# Patient Record
Sex: Female | Born: 1935
Health system: Southern US, Community
[De-identification: ages and names within clinical notes are randomized; demographics above are authoritative.]

## PROBLEM LIST (undated history)

## (undated) DIAGNOSIS — M797 Fibromyalgia: Secondary | ICD-10-CM

## (undated) DIAGNOSIS — H269 Unspecified cataract: Secondary | ICD-10-CM

## (undated) DIAGNOSIS — C50919 Malignant neoplasm of unspecified site of unspecified female breast: Secondary | ICD-10-CM

## (undated) DIAGNOSIS — Z8669 Personal history of other diseases of the nervous system and sense organs: Secondary | ICD-10-CM

## (undated) DIAGNOSIS — I10 Essential (primary) hypertension: Secondary | ICD-10-CM

## (undated) DIAGNOSIS — M25561 Pain in right knee: Secondary | ICD-10-CM

## (undated) DIAGNOSIS — E785 Hyperlipidemia, unspecified: Secondary | ICD-10-CM

## (undated) DIAGNOSIS — G5601 Carpal tunnel syndrome, right upper limb: Secondary | ICD-10-CM

## (undated) DIAGNOSIS — M5126 Other intervertebral disc displacement, lumbar region: Secondary | ICD-10-CM

## (undated) DIAGNOSIS — M549 Dorsalgia, unspecified: Secondary | ICD-10-CM

## (undated) DIAGNOSIS — J039 Acute tonsillitis, unspecified: Secondary | ICD-10-CM

## (undated) HISTORY — DX: Essential (primary) hypertension: I10

## (undated) HISTORY — PX: TONSILLECTOMY: SUR1361

## (undated) HISTORY — DX: Acute tonsillitis, unspecified: J03.90

## (undated) HISTORY — DX: Hyperlipidemia, unspecified: E78.5

## (undated) HISTORY — DX: Personal history of other diseases of the nervous system and sense organs: Z86.69

## (undated) HISTORY — DX: Malignant neoplasm of unspecified site of unspecified female breast: C50.919

## (undated) HISTORY — DX: Pain in right knee: M25.561

## (undated) HISTORY — DX: Fibromyalgia: M79.7

## (undated) HISTORY — DX: Dorsalgia, unspecified: M54.9

## (undated) HISTORY — DX: Other intervertebral disc displacement, lumbar region: M51.26

## (undated) HISTORY — PX: BREAST LUMPECTOMY WITH AXILLARY LYMPH NODE BIOPSY: SHX5593

## (undated) HISTORY — PX: ANKLE SURGERY: SHX546

## (undated) HISTORY — DX: Carpal tunnel syndrome, right upper limb: G56.01

## (undated) HISTORY — PX: CARPAL TUNNEL RELEASE: SHX101

## (undated) HISTORY — DX: Unspecified cataract: H26.9

---

## 2003-03-02 DIAGNOSIS — M549 Dorsalgia, unspecified: Secondary | ICD-10-CM

## 2003-03-02 HISTORY — DX: Dorsalgia, unspecified: M54.9

## 2004-03-01 HISTORY — PX: LUMBAR DISC SURGERY: SHX700

## 2004-08-19 ENCOUNTER — Emergency Department (HOSPITAL_COMMUNITY): Admission: EM | Admit: 2004-08-19 | Discharge: 2004-08-19 | Payer: Self-pay | Admitting: Emergency Medicine

## 2004-08-21 ENCOUNTER — Ambulatory Visit (HOSPITAL_COMMUNITY): Admission: RE | Admit: 2004-08-21 | Discharge: 2004-08-21 | Payer: Self-pay | Admitting: Emergency Medicine

## 2004-08-22 ENCOUNTER — Emergency Department (HOSPITAL_COMMUNITY): Admission: EM | Admit: 2004-08-22 | Discharge: 2004-08-22 | Payer: Self-pay | Admitting: Emergency Medicine

## 2004-09-02 ENCOUNTER — Encounter
Admission: RE | Admit: 2004-09-02 | Discharge: 2004-09-02 | Payer: Self-pay | Admitting: Physical Medicine and Rehabilitation

## 2004-10-16 ENCOUNTER — Encounter: Admission: RE | Admit: 2004-10-16 | Discharge: 2004-10-16 | Payer: Self-pay | Admitting: Orthopedic Surgery

## 2004-11-10 ENCOUNTER — Inpatient Hospital Stay (HOSPITAL_COMMUNITY): Admission: RE | Admit: 2004-11-10 | Discharge: 2004-11-12 | Payer: Self-pay | Admitting: Orthopedic Surgery

## 2006-03-01 DIAGNOSIS — M25561 Pain in right knee: Secondary | ICD-10-CM

## 2006-03-01 HISTORY — PX: CATARACT EXTRACTION: SUR2

## 2006-03-01 HISTORY — DX: Pain in right knee: M25.561

## 2007-01-30 ENCOUNTER — Ambulatory Visit: Payer: Self-pay | Admitting: Oncology

## 2007-02-01 LAB — CBC WITH DIFFERENTIAL/PLATELET
Basophils Absolute: 0 10*3/uL (ref 0.0–0.1)
Eosinophils Absolute: 0.1 10*3/uL (ref 0.0–0.5)
HGB: 13.4 g/dL (ref 11.6–15.9)
MONO#: 0.6 10*3/uL (ref 0.1–0.9)
NEUT#: 4 10*3/uL (ref 1.5–6.5)
RBC: 3.95 10*6/uL (ref 3.70–5.32)
RDW: 13.2 % (ref 11.3–14.5)
WBC: 6.1 10*3/uL (ref 3.9–10.0)

## 2007-02-01 LAB — COMPREHENSIVE METABOLIC PANEL
Albumin: 4.5 g/dL (ref 3.5–5.2)
BUN: 19 mg/dL (ref 6–23)
CO2: 29 mEq/L (ref 19–32)
Calcium: 9.5 mg/dL (ref 8.4–10.5)
Chloride: 99 mEq/L (ref 96–112)
Glucose, Bld: 103 mg/dL — ABNORMAL HIGH (ref 70–99)
Potassium: 4 mEq/L (ref 3.5–5.3)
Sodium: 140 mEq/L (ref 135–145)
Total Protein: 7.6 g/dL (ref 6.0–8.3)

## 2007-02-01 LAB — LACTATE DEHYDROGENASE: LDH: 176 U/L (ref 94–250)

## 2007-02-02 ENCOUNTER — Encounter: Payer: Self-pay | Admitting: Oncology

## 2007-02-02 ENCOUNTER — Ambulatory Visit: Payer: Self-pay

## 2007-02-06 LAB — VITAMIN D PNL(25-HYDRXY+1,25-DIHY)-BLD
Vit D, 1,25-Dihydroxy: 42 pg/mL (ref 6–62)
Vit D, 25-Hydroxy: 32 ng/mL (ref 30–89)

## 2007-02-08 ENCOUNTER — Ambulatory Visit (HOSPITAL_COMMUNITY): Admission: RE | Admit: 2007-02-08 | Discharge: 2007-02-08 | Payer: Self-pay | Admitting: Oncology

## 2007-02-13 ENCOUNTER — Ambulatory Visit (HOSPITAL_COMMUNITY): Admission: RE | Admit: 2007-02-13 | Discharge: 2007-02-13 | Payer: Self-pay | Admitting: Oncology

## 2007-02-22 ENCOUNTER — Ambulatory Visit (HOSPITAL_BASED_OUTPATIENT_CLINIC_OR_DEPARTMENT_OTHER): Admission: RE | Admit: 2007-02-22 | Discharge: 2007-02-22 | Payer: Self-pay | Admitting: Surgery

## 2007-03-08 LAB — CBC WITH DIFFERENTIAL/PLATELET
Basophils Absolute: 0 10*3/uL (ref 0.0–0.1)
Eosinophils Absolute: 0.2 10*3/uL (ref 0.0–0.5)
HGB: 12.4 g/dL (ref 11.6–15.9)
NEUT#: 1.9 10*3/uL (ref 1.5–6.5)
RDW: 12.9 % (ref 11.3–14.5)
lymph#: 0.9 10*3/uL (ref 0.9–3.3)

## 2007-03-08 IMAGING — CT CT L SPINE W/ CM
3 of 8 series · 15 of 33 positions shown, 18 images · IV contrast (omnipaque)
Comparison: none

CLINICAL DATA: Pain in the back extends into the right knee and leg.   The patient was referred for a lumbar myelogram. 
 LUMBAR MYELOGRAM:
 Using sterile technique, a 22 gauge spinal needle was placed into the thecal sac centrally at L4.  CSF is clear. 15 cc Omnipaque 180 contrast was injected.  The needle was withdrawn and multiple images were obtained. 
 Patient with five non-rib bearing lumbar vertebra.  Mild rotoscoliotic deformity.  Central stenosis is appreciated at L4-5.  Poor filling of the right L4 and right L5 root sleeves.  An extradural defect is demonstrated on the right most notably at L4-5.  In the lateral projection, ventral defects are seen at L2-3, L3-4 and L4-5.

[Series 4: recon 3: l-spine helical · axial · 0.27mm/px · z∈[-49,+85]mm · 7 of 143 slices shown, 9 images]
[im 18/143  soft-tissue]
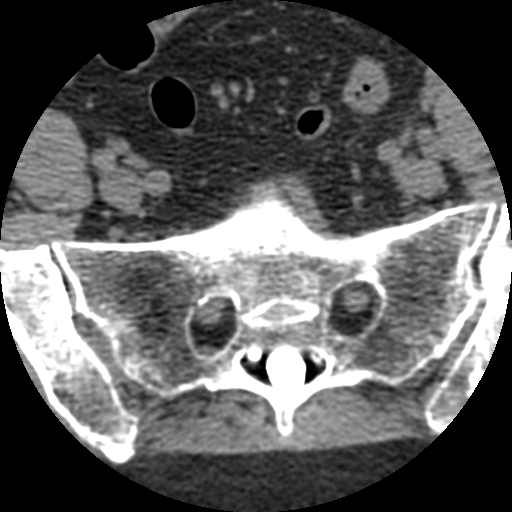
[im 18/143  bone]
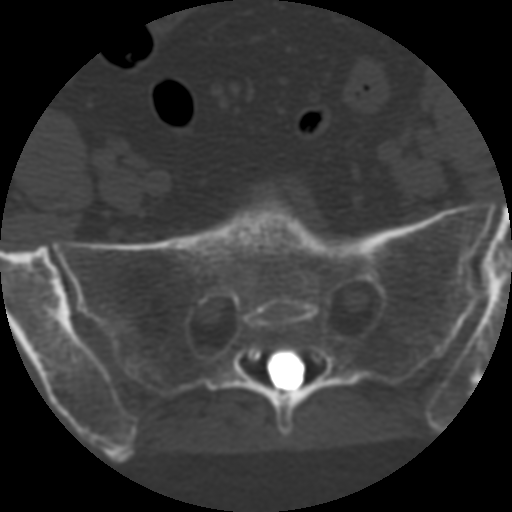
[im 36/143  bone]
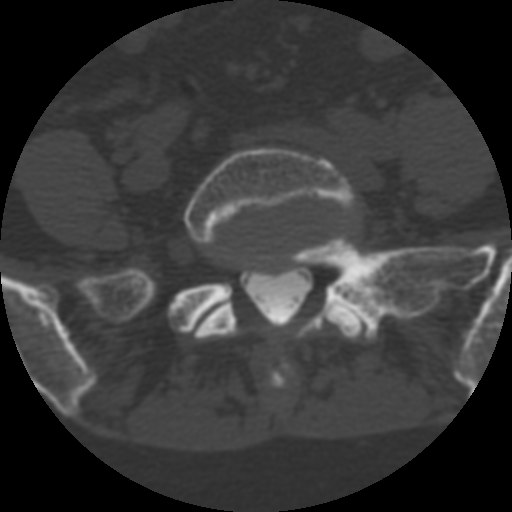
[im 54/143  bone]
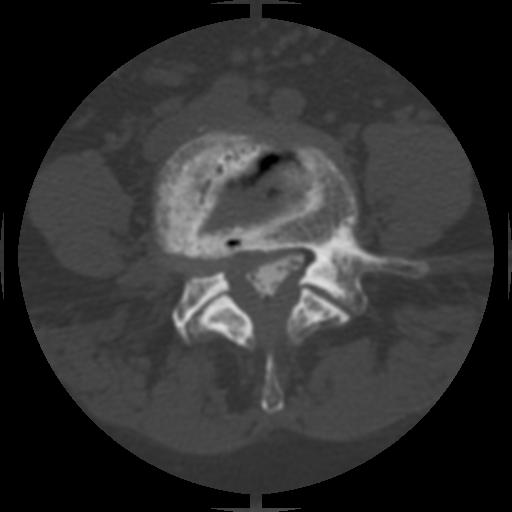
[im 72/143  bone]
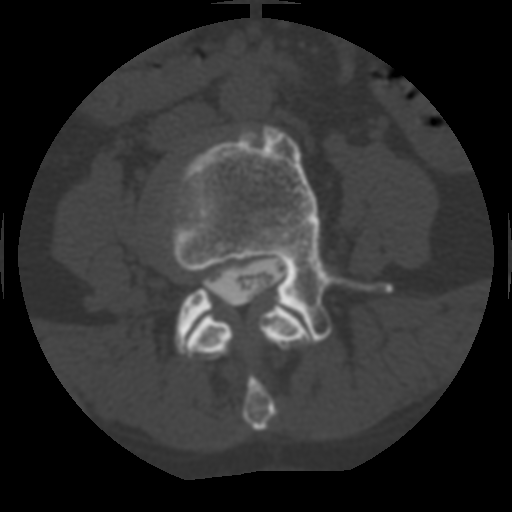
[im 89/143  soft-tissue]
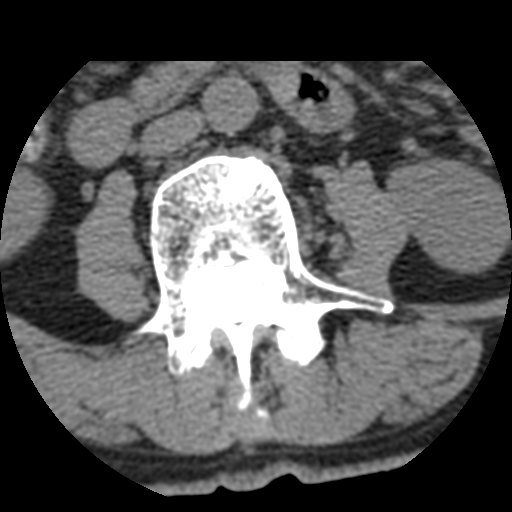
[im 89/143  bone]
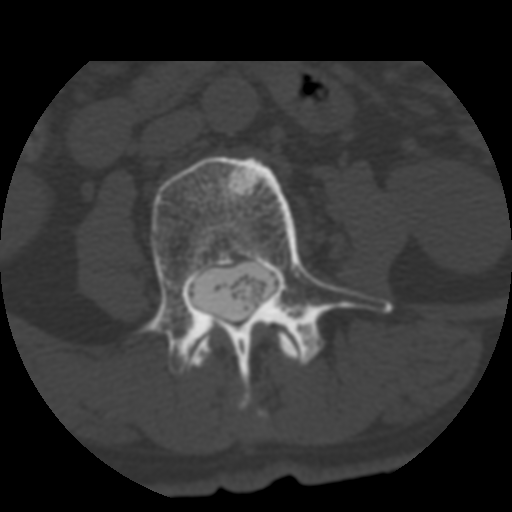
[im 107/143  bone]
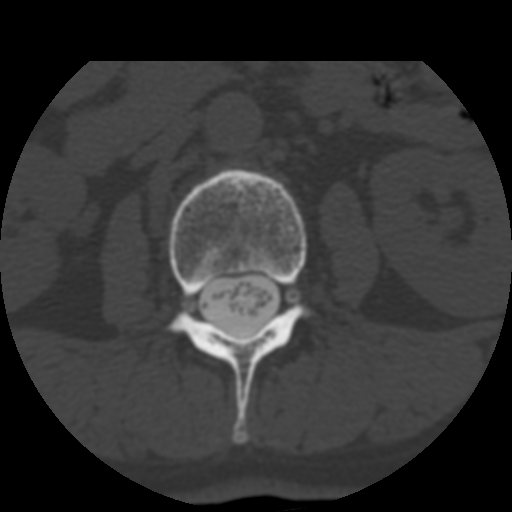
[im 125/143  bone]
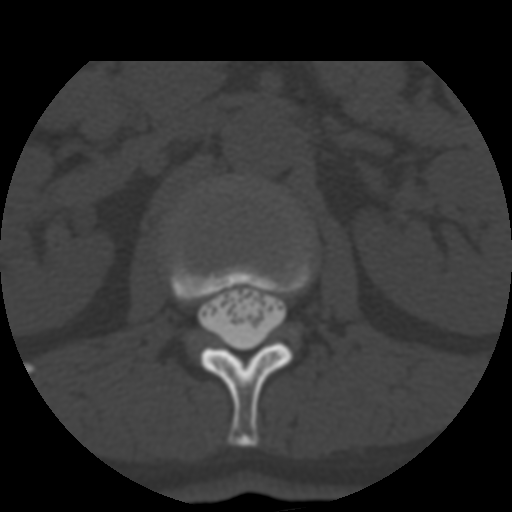

[Series 400: reformatted · sagittal · 0.36mm/px · 3 of 40 slices shown (1 of 2)]
[im 8/40  bone]
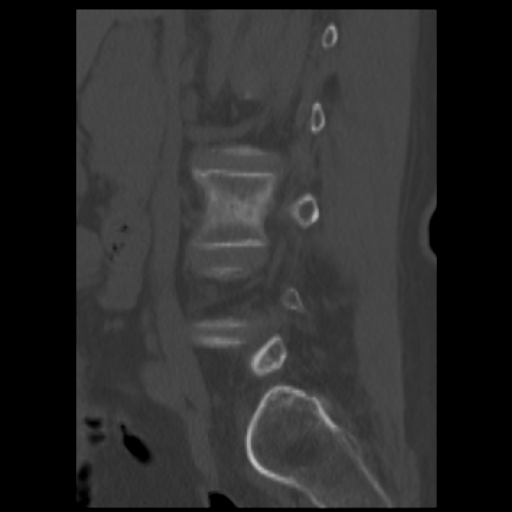
[im 16/40  bone]
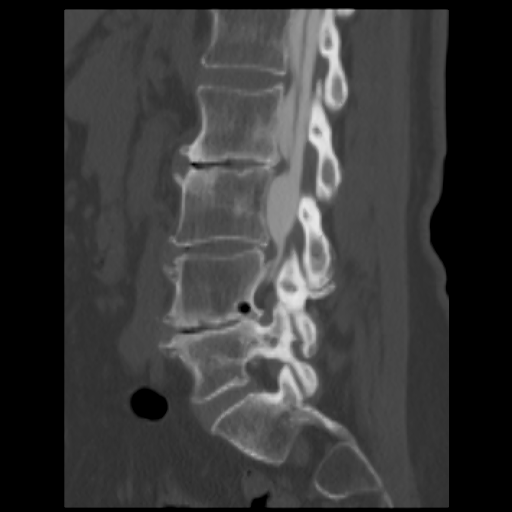
[im 24/40  bone]
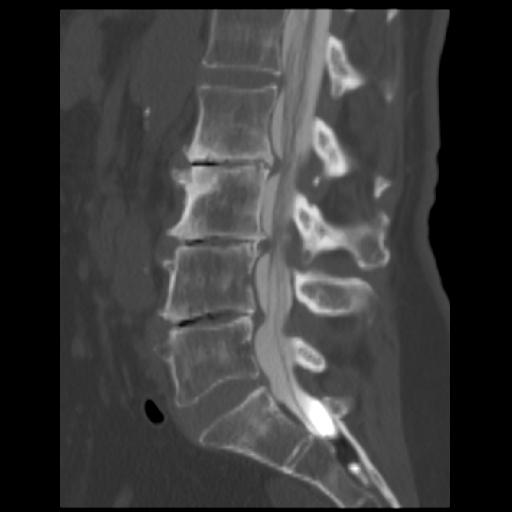

[Series 401: reformatted · coronal · 0.36mm/px · 5 of 40 slices shown, 6 images (2 of 2)]
[im 14/40  bone]
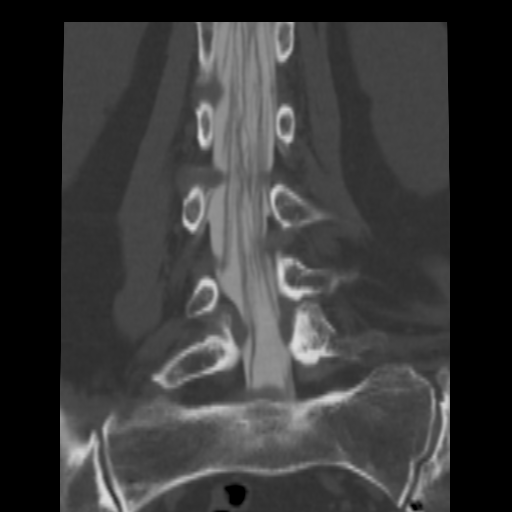
[im 17/40  bone]
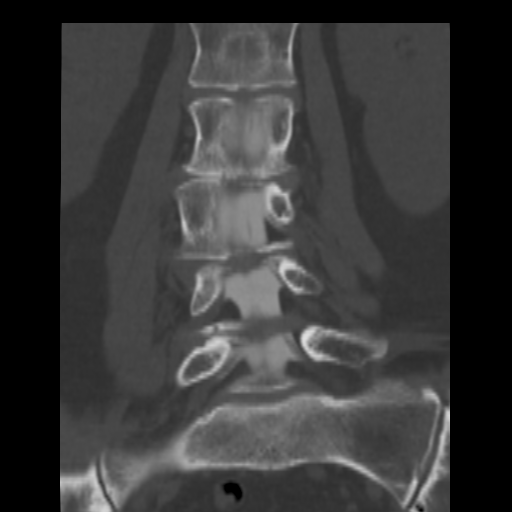
[im 20/40  soft-tissue]
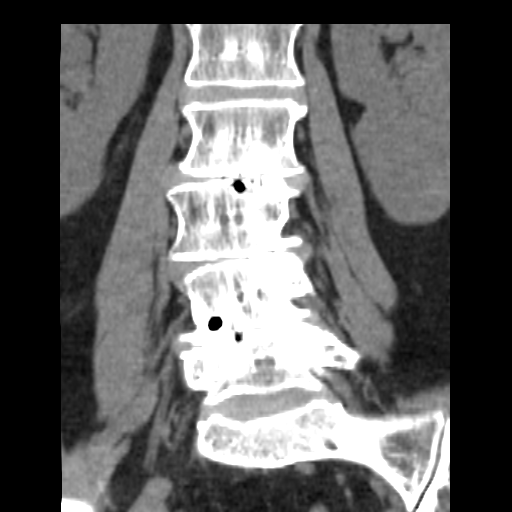
[im 20/40  bone]
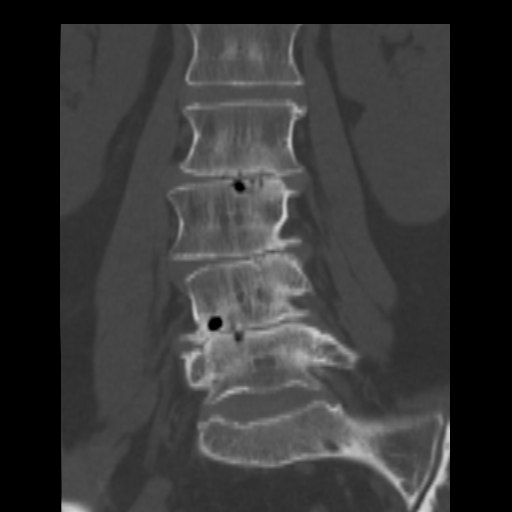
[im 23/40  bone]
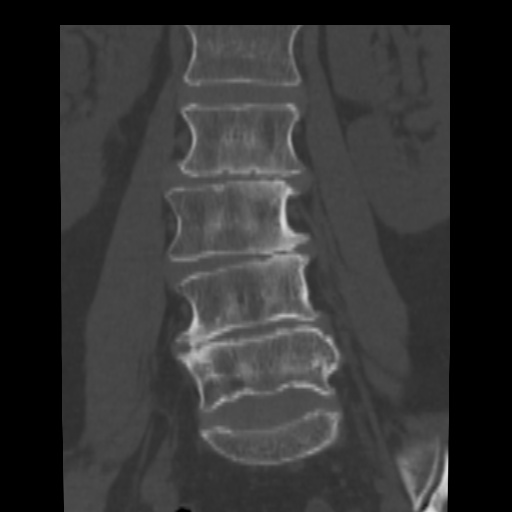
[im 27/40  bone]
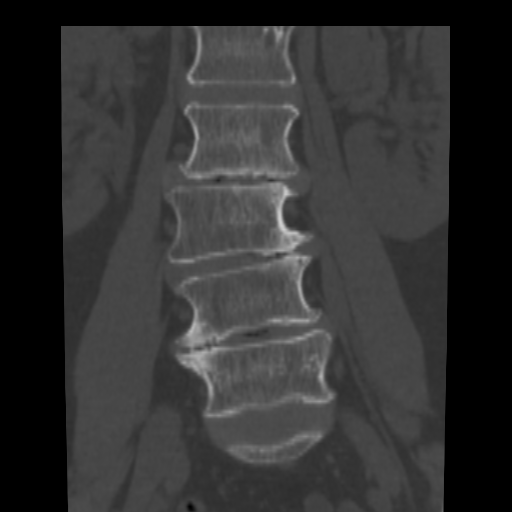

[15 of 33 positions shown; findings below may reference images not displayed]

IMPRESSION: Multilevel degenerative changes most severely at L4-5.  Central stenosis is appreciated with poor filling of the right L4 and L5 root sleeves.  CT to follow.
 CT LUMBAR SPINE POST INTRATHECAL CONTRAST:
 Helical imaging was performed and supplemented with coronal and sagittal reformat images:
 In the sagittal projection, degenerative disk disease is appreciated at L2-3, L3-4, and L4-5.  Ventral defects at each level.  At L4-5, foraminal narrowing is seen bilaterally, right greater than left which is multifactorial.  
 Axial imaging: 
 L1-2:  Essentially normal interspace.
 L2-3:  Disk height loss with vacuum disk phenomenon.  Reactive endplate changes noted.   Diffuse disk bulge without central nor foraminal stenosis.   Facet degenerative change.
 L3-4:  Left foraminal stenosis associated with a foraminal disk bulge and facet hypertrophy.  Additionally, there is left lateral recess stenosis.  The right L3 root exits without encroachment.  No appreciable central stenosis.
 L4-5:  Right paracentral/foraminal disk extrusion.  Associated facet degenerative change bilaterally.  Severe multifactorial central stenosis as well as right lateral recess stenosis.  Disk material in addition to osteophytic spurring narrow the right L4 foramen and likely contributes to L4 symptoms.  The left L4 foramen is narrowed inferiorly without encroachment upon the dorsal root ganglion.
 L5-S1:  Shallow central disk protrusion without mass effect.  The L5 roots exit without encroachment.  No central stenosis or facet degenerative change.
IMPRESSION: 1.  L4-5:   Paracentral/foraminal disk extrusion with caudal extension of disk material.  Additionally, there is disk height loss and facet hypertrophy.  Multifactorial right foraminal stenosis as well as right lateral recess and multifactorial central stenosis.
 2.  L3-4:  Left foraminal stenosis is multifactorial.
 3.  L2-3:  Degenerative disk disease.  No appreciable central nor foraminal stenosis.

## 2007-03-15 ENCOUNTER — Ambulatory Visit: Payer: Self-pay | Admitting: Oncology

## 2007-03-17 LAB — CBC WITH DIFFERENTIAL/PLATELET
Basophils Absolute: 0.1 10*3/uL (ref 0.0–0.1)
Eosinophils Absolute: 0 10*3/uL (ref 0.0–0.5)
HGB: 11.7 g/dL (ref 11.6–15.9)
LYMPH%: 13.7 % — ABNORMAL LOW (ref 14.0–48.0)
MCV: 95.1 fL (ref 81.0–101.0)
MONO%: 6.5 % (ref 0.0–13.0)
NEUT#: 9.9 10*3/uL — ABNORMAL HIGH (ref 1.5–6.5)
Platelets: 142 10*3/uL — ABNORMAL LOW (ref 145–400)
RDW: 10.5 % — ABNORMAL LOW (ref 11.3–14.5)

## 2007-03-17 LAB — BASIC METABOLIC PANEL
BUN: 19 mg/dL (ref 6–23)
CO2: 25 mEq/L (ref 19–32)
Glucose, Bld: 117 mg/dL — ABNORMAL HIGH (ref 70–99)
Potassium: 4.1 mEq/L (ref 3.5–5.3)

## 2007-03-24 LAB — CBC WITH DIFFERENTIAL/PLATELET
BASO%: 0.1 % (ref 0.0–2.0)
Basophils Absolute: 0 10*3/uL (ref 0.0–0.1)
Eosinophils Absolute: 0 10*3/uL (ref 0.0–0.5)
HCT: 30.3 % — ABNORMAL LOW (ref 34.8–46.6)
HGB: 10.8 g/dL — ABNORMAL LOW (ref 11.6–15.9)
LYMPH%: 7.4 % — ABNORMAL LOW (ref 14.0–48.0)
MONO#: 0.5 10*3/uL (ref 0.1–0.9)
NEUT#: 12.3 10*3/uL — ABNORMAL HIGH (ref 1.5–6.5)
NEUT%: 88.7 % — ABNORMAL HIGH (ref 39.6–76.8)
Platelets: 335 10*3/uL (ref 145–400)
WBC: 13.9 10*3/uL — ABNORMAL HIGH (ref 3.9–10.0)
lymph#: 1 10*3/uL (ref 0.9–3.3)

## 2007-03-24 LAB — COMPREHENSIVE METABOLIC PANEL
ALT: 15 U/L (ref 0–35)
BUN: 16 mg/dL (ref 6–23)
CO2: 23 mEq/L (ref 19–32)
Calcium: 8.8 mg/dL (ref 8.4–10.5)
Chloride: 96 mEq/L (ref 96–112)
Creatinine, Ser: 0.59 mg/dL (ref 0.40–1.20)
Glucose, Bld: 179 mg/dL — ABNORMAL HIGH (ref 70–99)

## 2007-03-31 LAB — CBC WITH DIFFERENTIAL/PLATELET
BASO%: 1.1 % (ref 0.0–2.0)
Eosinophils Absolute: 0 10*3/uL (ref 0.0–0.5)
HCT: 32.3 % — ABNORMAL LOW (ref 34.8–46.6)
MCHC: 36.3 g/dL — ABNORMAL HIGH (ref 32.0–36.0)
MONO#: 2.1 10*3/uL — ABNORMAL HIGH (ref 0.1–0.9)
NEUT#: 10.9 10*3/uL — ABNORMAL HIGH (ref 1.5–6.5)
NEUT%: 71.4 % (ref 39.6–76.8)
WBC: 15.3 10*3/uL — ABNORMAL HIGH (ref 3.9–10.0)
lymph#: 2 10*3/uL (ref 0.9–3.3)

## 2007-03-31 LAB — COMPREHENSIVE METABOLIC PANEL
ALT: 19 U/L (ref 0–35)
CO2: 27 mEq/L (ref 19–32)
Calcium: 8.9 mg/dL (ref 8.4–10.5)
Chloride: 90 mEq/L — ABNORMAL LOW (ref 96–112)
Sodium: 130 mEq/L — ABNORMAL LOW (ref 135–145)
Total Protein: 7 g/dL (ref 6.0–8.3)

## 2007-03-31 LAB — LACTATE DEHYDROGENASE: LDH: 234 U/L (ref 94–250)

## 2007-04-07 LAB — CBC WITH DIFFERENTIAL/PLATELET
Basophils Absolute: 0.1 10*3/uL (ref 0.0–0.1)
Eosinophils Absolute: 0 10*3/uL (ref 0.0–0.5)
HCT: 29.3 % — ABNORMAL LOW (ref 34.8–46.6)
HGB: 10.7 g/dL — ABNORMAL LOW (ref 11.6–15.9)
LYMPH%: 7.7 % — ABNORMAL LOW (ref 14.0–48.0)
MCV: 96.1 fL (ref 81.0–101.0)
MONO%: 4.6 % (ref 0.0–13.0)
NEUT#: 15.1 10*3/uL — ABNORMAL HIGH (ref 1.5–6.5)
NEUT%: 87.3 % — ABNORMAL HIGH (ref 39.6–76.8)
Platelets: 153 10*3/uL (ref 145–400)

## 2007-04-07 LAB — COMPREHENSIVE METABOLIC PANEL
Albumin: 4.3 g/dL (ref 3.5–5.2)
Alkaline Phosphatase: 101 U/L (ref 39–117)
BUN: 22 mg/dL (ref 6–23)
Glucose, Bld: 94 mg/dL (ref 70–99)
Total Bilirubin: 0.3 mg/dL (ref 0.3–1.2)

## 2007-04-14 LAB — CBC WITH DIFFERENTIAL/PLATELET
Basophils Absolute: 0 10*3/uL (ref 0.0–0.1)
Eosinophils Absolute: 0 10*3/uL (ref 0.0–0.5)
HGB: 11.4 g/dL — ABNORMAL LOW (ref 11.6–15.9)
MCV: 97.1 fL (ref 81.0–101.0)
MONO#: 0.2 10*3/uL (ref 0.1–0.9)
MONO%: 3 % (ref 0.0–13.0)
NEUT#: 7.3 10*3/uL — ABNORMAL HIGH (ref 1.5–6.5)
Platelets: 203 10*3/uL (ref 145–400)
RDW: 14.8 % — ABNORMAL HIGH (ref 11.3–14.5)

## 2007-04-14 LAB — BASIC METABOLIC PANEL
BUN: 22 mg/dL (ref 6–23)
CO2: 24 mEq/L (ref 19–32)
Glucose, Bld: 136 mg/dL — ABNORMAL HIGH (ref 70–99)
Potassium: 3.9 mEq/L (ref 3.5–5.3)

## 2007-04-21 LAB — CBC WITH DIFFERENTIAL/PLATELET
BASO%: 0.5 % (ref 0.0–2.0)
Basophils Absolute: 0.1 10*3/uL (ref 0.0–0.1)
EOS%: 0.1 % (ref 0.0–7.0)
HCT: 29.6 % — ABNORMAL LOW (ref 34.8–46.6)
HGB: 10.4 g/dL — ABNORMAL LOW (ref 11.6–15.9)
LYMPH%: 9.3 % — ABNORMAL LOW (ref 14.0–48.0)
MCH: 35.8 pg — ABNORMAL HIGH (ref 26.0–34.0)
MCHC: 35.2 g/dL (ref 32.0–36.0)
MCV: 101.5 fL — ABNORMAL HIGH (ref 81.0–101.0)
MONO%: 5.7 % (ref 0.0–13.0)
NEUT%: 84.4 % — ABNORMAL HIGH (ref 39.6–76.8)
Platelets: 219 10*3/uL (ref 145–400)

## 2007-04-26 ENCOUNTER — Ambulatory Visit: Payer: Self-pay | Admitting: Oncology

## 2007-04-28 LAB — CBC WITH DIFFERENTIAL/PLATELET
BASO%: 1 % (ref 0.0–2.0)
Basophils Absolute: 0.1 10*3/uL (ref 0.0–0.1)
EOS%: 0.1 % (ref 0.0–7.0)
HGB: 11.3 g/dL — ABNORMAL LOW (ref 11.6–15.9)
MCH: 36.5 pg — ABNORMAL HIGH (ref 26.0–34.0)
MCHC: 35.2 g/dL (ref 32.0–36.0)
MCV: 104 fL — ABNORMAL HIGH (ref 81.0–101.0)
MONO%: 6.6 % (ref 0.0–13.0)
RDW: 16.3 % — ABNORMAL HIGH (ref 11.3–14.5)
lymph#: 1.1 10*3/uL (ref 0.9–3.3)

## 2007-04-28 LAB — BASIC METABOLIC PANEL
BUN: 29 mg/dL — ABNORMAL HIGH (ref 6–23)
Chloride: 97 mEq/L (ref 96–112)
Creatinine, Ser: 0.49 mg/dL (ref 0.40–1.20)
Potassium: 4.1 mEq/L (ref 3.5–5.3)

## 2007-05-05 LAB — COMPREHENSIVE METABOLIC PANEL
Albumin: 4.5 g/dL (ref 3.5–5.2)
BUN: 26 mg/dL — ABNORMAL HIGH (ref 6–23)
CO2: 24 mEq/L (ref 19–32)
Calcium: 9.1 mg/dL (ref 8.4–10.5)
Chloride: 100 mEq/L (ref 96–112)
Glucose, Bld: 120 mg/dL — ABNORMAL HIGH (ref 70–99)
Potassium: 4.2 mEq/L (ref 3.5–5.3)
Sodium: 139 mEq/L (ref 135–145)
Total Protein: 7 g/dL (ref 6.0–8.3)

## 2007-05-05 LAB — CBC WITH DIFFERENTIAL/PLATELET
BASO%: 0.2 % (ref 0.0–2.0)
Basophils Absolute: 0 10*3/uL (ref 0.0–0.1)
EOS%: 0.1 % (ref 0.0–7.0)
Eosinophils Absolute: 0 10*3/uL (ref 0.0–0.5)
HCT: 31.5 % — ABNORMAL LOW (ref 34.8–46.6)
HGB: 10.8 g/dL — ABNORMAL LOW (ref 11.6–15.9)
LYMPH%: 5.3 % — ABNORMAL LOW (ref 14.0–48.0)
MCH: 36.1 pg — ABNORMAL HIGH (ref 26.0–34.0)
MCHC: 34.4 g/dL (ref 32.0–36.0)
MCV: 105 fL — ABNORMAL HIGH (ref 81.0–101.0)
MONO#: 0.7 10*3/uL (ref 0.1–0.9)
MONO%: 5 % (ref 0.0–13.0)
NEUT#: 12.2 10*3/uL — ABNORMAL HIGH (ref 1.5–6.5)
NEUT%: 89.5 % — ABNORMAL HIGH (ref 39.6–76.8)
Platelets: 230 10*3/uL (ref 145–400)
RBC: 3 10*6/uL — ABNORMAL LOW (ref 3.70–5.32)
RDW: 16.6 % — ABNORMAL HIGH (ref 11.3–14.5)
WBC: 13.6 10*3/uL — ABNORMAL HIGH (ref 3.9–10.0)
lymph#: 0.7 10*3/uL — ABNORMAL LOW (ref 0.9–3.3)

## 2007-05-12 LAB — CBC WITH DIFFERENTIAL/PLATELET
Basophils Absolute: 0.2 10*3/uL — ABNORMAL HIGH (ref 0.0–0.1)
Eosinophils Absolute: 0 10*3/uL (ref 0.0–0.5)
HGB: 11.4 g/dL — ABNORMAL LOW (ref 11.6–15.9)
MCV: 104 fL — ABNORMAL HIGH (ref 81.0–101.0)
MONO%: 13.9 % — ABNORMAL HIGH (ref 0.0–13.0)
NEUT#: 9.9 10*3/uL — ABNORMAL HIGH (ref 1.5–6.5)
RBC: 3.12 10*6/uL — ABNORMAL LOW (ref 3.70–5.32)
RDW: 14.9 % — ABNORMAL HIGH (ref 11.3–14.5)
WBC: 13.4 10*3/uL — ABNORMAL HIGH (ref 3.9–10.0)
lymph#: 1.4 10*3/uL (ref 0.9–3.3)

## 2007-05-19 LAB — CBC WITH DIFFERENTIAL/PLATELET
Basophils Absolute: 0.2 10*3/uL — ABNORMAL HIGH (ref 0.0–0.1)
Eosinophils Absolute: 0 10*3/uL (ref 0.0–0.5)
HGB: 11.1 g/dL — ABNORMAL LOW (ref 11.6–15.9)
MCV: 108 fL — ABNORMAL HIGH (ref 81.0–101.0)
MONO%: 10.3 % (ref 0.0–13.0)
NEUT#: 6.9 10*3/uL — ABNORMAL HIGH (ref 1.5–6.5)
RDW: 15.9 % — ABNORMAL HIGH (ref 11.3–14.5)
lymph#: 1.2 10*3/uL (ref 0.9–3.3)

## 2007-06-02 LAB — CBC WITH DIFFERENTIAL/PLATELET
BASO%: 0.4 % (ref 0.0–2.0)
Basophils Absolute: 0.1 10*3/uL (ref 0.0–0.1)
EOS%: 0.1 % (ref 0.0–7.0)
Eosinophils Absolute: 0 10*3/uL (ref 0.0–0.5)
HCT: 32 % — ABNORMAL LOW (ref 34.8–46.6)
HGB: 11.3 g/dL — ABNORMAL LOW (ref 11.6–15.9)
LYMPH%: 14.1 % (ref 14.0–48.0)
MCH: 37.5 pg — ABNORMAL HIGH (ref 26.0–34.0)
MCHC: 35.1 g/dL (ref 32.0–36.0)
MCV: 107 fL — ABNORMAL HIGH (ref 81.0–101.0)
MONO#: 0.4 10*3/uL (ref 0.1–0.9)
MONO%: 3 % (ref 0.0–13.0)
NEUT#: 11 10*3/uL — ABNORMAL HIGH (ref 1.5–6.5)
NEUT%: 82.5 % — ABNORMAL HIGH (ref 39.6–76.8)
Platelets: 175 10*3/uL (ref 145–400)
RBC: 3 10*6/uL — ABNORMAL LOW (ref 3.70–5.32)
RDW: 13.9 % (ref 11.3–14.5)
WBC: 13.3 10*3/uL — ABNORMAL HIGH (ref 3.9–10.0)
lymph#: 1.9 10*3/uL (ref 0.9–3.3)

## 2007-06-07 ENCOUNTER — Ambulatory Visit: Payer: Self-pay | Admitting: Oncology

## 2007-06-09 LAB — CBC WITH DIFFERENTIAL/PLATELET
Basophils Absolute: 0.1 10*3/uL (ref 0.0–0.1)
Eosinophils Absolute: 0 10*3/uL (ref 0.0–0.5)
HGB: 11.6 g/dL (ref 11.6–15.9)
MCV: 109 fL — ABNORMAL HIGH (ref 81.0–101.0)
MONO%: 8.8 % (ref 0.0–13.0)
NEUT#: 6.9 10*3/uL — ABNORMAL HIGH (ref 1.5–6.5)
Platelets: 130 10*3/uL — ABNORMAL LOW (ref 145–400)
RDW: 14.3 % (ref 11.3–14.5)

## 2007-06-15 LAB — CBC WITH DIFFERENTIAL/PLATELET
Basophils Absolute: 0 10*3/uL (ref 0.0–0.1)
Eosinophils Absolute: 0 10*3/uL (ref 0.0–0.5)
HGB: 11.1 g/dL — ABNORMAL LOW (ref 11.6–15.9)
LYMPH%: 16.4 % (ref 14.0–48.0)
MCV: 108.9 fL — ABNORMAL HIGH (ref 81.0–101.0)
MONO#: 0.4 10*3/uL (ref 0.1–0.9)
MONO%: 7.2 % (ref 0.0–13.0)
NEUT#: 4.6 10*3/uL (ref 1.5–6.5)
Platelets: 152 10*3/uL (ref 145–400)
WBC: 6.1 10*3/uL (ref 3.9–10.0)

## 2007-06-15 LAB — COMPREHENSIVE METABOLIC PANEL
Albumin: 4.4 g/dL (ref 3.5–5.2)
Alkaline Phosphatase: 71 U/L (ref 39–117)
BUN: 21 mg/dL (ref 6–23)
CO2: 28 mEq/L (ref 19–32)
Glucose, Bld: 99 mg/dL (ref 70–99)
Potassium: 3.7 mEq/L (ref 3.5–5.3)
Total Bilirubin: 0.2 mg/dL — ABNORMAL LOW (ref 0.3–1.2)
Total Protein: 6.7 g/dL (ref 6.0–8.3)

## 2007-06-23 LAB — CBC WITH DIFFERENTIAL/PLATELET
Basophils Absolute: 0.1 10*3/uL (ref 0.0–0.1)
Eosinophils Absolute: 0 10*3/uL (ref 0.0–0.5)
HGB: 11.4 g/dL — ABNORMAL LOW (ref 11.6–15.9)
MCV: 109 fL — ABNORMAL HIGH (ref 81.0–101.0)
MONO#: 1.4 10*3/uL — ABNORMAL HIGH (ref 0.1–0.9)
NEUT#: 8.6 10*3/uL — ABNORMAL HIGH (ref 1.5–6.5)
RDW: 12.6 % (ref 11.3–14.5)
WBC: 11.8 10*3/uL — ABNORMAL HIGH (ref 3.9–10.0)
lymph#: 1.7 10*3/uL (ref 0.9–3.3)

## 2007-06-30 LAB — CBC WITH DIFFERENTIAL/PLATELET
BASO%: 1.8 % (ref 0.0–2.0)
EOS%: 0 % (ref 0.0–7.0)
MCH: 37.7 pg — ABNORMAL HIGH (ref 26.0–34.0)
MCHC: 34.5 g/dL (ref 32.0–36.0)
RDW: 12.7 % (ref 11.3–14.5)
lymph#: 0.9 10*3/uL (ref 0.9–3.3)

## 2007-07-03 ENCOUNTER — Ambulatory Visit: Admission: RE | Admit: 2007-07-03 | Discharge: 2007-07-30 | Payer: Self-pay | Admitting: Radiation Oncology

## 2007-07-11 ENCOUNTER — Ambulatory Visit (HOSPITAL_BASED_OUTPATIENT_CLINIC_OR_DEPARTMENT_OTHER): Admission: RE | Admit: 2007-07-11 | Discharge: 2007-07-11 | Payer: Self-pay | Admitting: Surgery

## 2007-07-11 ENCOUNTER — Encounter (INDEPENDENT_AMBULATORY_CARE_PROVIDER_SITE_OTHER): Payer: Self-pay | Admitting: Surgery

## 2007-08-01 ENCOUNTER — Ambulatory Visit: Payer: Self-pay | Admitting: Oncology

## 2007-08-03 LAB — CANCER ANTIGEN 27.29: CA 27.29: 16 U/mL (ref 0–39)

## 2007-08-03 LAB — CBC WITH DIFFERENTIAL/PLATELET
Basophils Absolute: 0 10*3/uL (ref 0.0–0.1)
Eosinophils Absolute: 0.1 10*3/uL (ref 0.0–0.5)
HCT: 32.5 % — ABNORMAL LOW (ref 34.8–46.6)
HGB: 11.4 g/dL — ABNORMAL LOW (ref 11.6–15.9)
LYMPH%: 26.2 % (ref 14.0–48.0)
MCV: 106.5 fL — ABNORMAL HIGH (ref 81.0–101.0)
MONO#: 0.5 10*3/uL (ref 0.1–0.9)
NEUT#: 2.2 10*3/uL (ref 1.5–6.5)
NEUT%: 57.2 % (ref 39.6–76.8)
Platelets: 184 10*3/uL (ref 145–400)
RBC: 3.05 10*6/uL — ABNORMAL LOW (ref 3.70–5.32)
WBC: 3.9 10*3/uL (ref 3.9–10.0)

## 2007-08-03 LAB — COMPREHENSIVE METABOLIC PANEL
Albumin: 4.2 g/dL (ref 3.5–5.2)
BUN: 26 mg/dL — ABNORMAL HIGH (ref 6–23)
Calcium: 9.5 mg/dL (ref 8.4–10.5)
Chloride: 101 mEq/L (ref 96–112)
Creatinine, Ser: 0.64 mg/dL (ref 0.40–1.20)
Glucose, Bld: 104 mg/dL — ABNORMAL HIGH (ref 70–99)
Potassium: 3.7 mEq/L (ref 3.5–5.3)

## 2007-08-10 ENCOUNTER — Encounter: Payer: Self-pay | Admitting: Oncology

## 2007-08-10 ENCOUNTER — Ambulatory Visit: Payer: Self-pay

## 2007-08-11 LAB — CBC WITH DIFFERENTIAL/PLATELET
BASO%: 1.7 % (ref 0.0–2.0)
LYMPH%: 11.8 % — ABNORMAL LOW (ref 14.0–48.0)
MCHC: 34.5 g/dL (ref 32.0–36.0)
MONO#: 0.6 10*3/uL (ref 0.1–0.9)
NEUT#: 3.2 10*3/uL (ref 1.5–6.5)
RBC: 3.38 10*6/uL — ABNORMAL LOW (ref 3.70–5.32)
RDW: 10.9 % — ABNORMAL LOW (ref 11.3–14.5)
WBC: 4.6 10*3/uL (ref 3.9–10.0)
lymph#: 0.5 10*3/uL — ABNORMAL LOW (ref 0.9–3.3)

## 2007-08-14 LAB — COMPREHENSIVE METABOLIC PANEL
ALT: 9 U/L (ref 0–35)
Albumin: 4.2 g/dL (ref 3.5–5.2)
CO2: 24 mEq/L (ref 19–32)
Chloride: 108 mEq/L (ref 96–112)
Potassium: 3.8 mEq/L (ref 3.5–5.3)
Sodium: 143 mEq/L (ref 135–145)
Total Bilirubin: 0.5 mg/dL (ref 0.3–1.2)
Total Protein: 7.5 g/dL (ref 6.0–8.3)

## 2007-08-14 LAB — LACTATE DEHYDROGENASE: LDH: 186 U/L (ref 94–250)

## 2007-08-14 LAB — VITAMIN D 25 HYDROXY (VIT D DEFICIENCY, FRACTURES): Vit D, 25-Hydroxy: 26 ng/mL — ABNORMAL LOW (ref 30–89)

## 2007-08-14 LAB — CANCER ANTIGEN 27.29: CA 27.29: 14 U/mL (ref 0–39)

## 2007-08-31 LAB — COMPREHENSIVE METABOLIC PANEL
Alkaline Phosphatase: 48 U/L (ref 39–117)
BUN: 21 mg/dL (ref 6–23)
CO2: 26 mEq/L (ref 19–32)
Creatinine, Ser: 0.52 mg/dL (ref 0.40–1.20)
Glucose, Bld: 99 mg/dL (ref 70–99)
Total Bilirubin: 0.3 mg/dL (ref 0.3–1.2)

## 2007-08-31 LAB — CBC WITH DIFFERENTIAL/PLATELET
Eosinophils Absolute: 0.1 10*3/uL (ref 0.0–0.5)
HCT: 34 % — ABNORMAL LOW (ref 34.8–46.6)
LYMPH%: 25.2 % (ref 14.0–48.0)
MCV: 102 fL — ABNORMAL HIGH (ref 81.0–101.0)
MONO#: 0.5 10*3/uL (ref 0.1–0.9)
MONO%: 13 % (ref 0.0–13.0)
NEUT#: 2.3 10*3/uL (ref 1.5–6.5)
NEUT%: 58.6 % (ref 39.6–76.8)
Platelets: 168 10*3/uL (ref 145–400)
RBC: 3.33 10*6/uL — ABNORMAL LOW (ref 3.70–5.32)
WBC: 3.8 10*3/uL — ABNORMAL LOW (ref 3.9–10.0)

## 2007-09-19 ENCOUNTER — Ambulatory Visit: Payer: Self-pay | Admitting: Oncology

## 2007-10-13 LAB — COMPREHENSIVE METABOLIC PANEL
ALT: 19 U/L (ref 0–35)
Alkaline Phosphatase: 52 U/L (ref 39–117)
CO2: 26 mEq/L (ref 19–32)
Creatinine, Ser: 0.54 mg/dL (ref 0.40–1.20)
Glucose, Bld: 104 mg/dL — ABNORMAL HIGH (ref 70–99)
Sodium: 137 mEq/L (ref 135–145)
Total Bilirubin: 0.3 mg/dL (ref 0.3–1.2)
Total Protein: 7.3 g/dL (ref 6.0–8.3)

## 2007-10-13 LAB — CBC WITH DIFFERENTIAL/PLATELET
BASO%: 0.5 % (ref 0.0–2.0)
HCT: 36.4 % (ref 34.8–46.6)
LYMPH%: 21 % (ref 14.0–48.0)
MCHC: 34.9 g/dL (ref 32.0–36.0)
MCV: 100 fL (ref 81.0–101.0)
MONO%: 10.8 % (ref 0.0–13.0)
NEUT%: 65.8 % (ref 39.6–76.8)
Platelets: 137 10*3/uL — ABNORMAL LOW (ref 145–400)
RBC: 3.64 10*6/uL — ABNORMAL LOW (ref 3.70–5.32)

## 2007-11-01 ENCOUNTER — Ambulatory Visit: Payer: Self-pay | Admitting: Oncology

## 2007-11-03 LAB — CBC WITH DIFFERENTIAL/PLATELET
Basophils Absolute: 0.1 10*3/uL (ref 0.0–0.1)
Eosinophils Absolute: 0.1 10*3/uL (ref 0.0–0.5)
HCT: 36.3 % (ref 34.8–46.6)
HGB: 12.6 g/dL (ref 11.6–15.9)
LYMPH%: 35.6 % (ref 14.0–48.0)
MONO#: 0.4 10*3/uL (ref 0.1–0.9)
NEUT#: 2.2 10*3/uL (ref 1.5–6.5)
NEUT%: 49.6 % (ref 39.6–76.8)
Platelets: 172 10*3/uL (ref 145–400)
WBC: 4.4 10*3/uL (ref 3.9–10.0)

## 2007-11-23 LAB — COMPREHENSIVE METABOLIC PANEL
BUN: 20 mg/dL (ref 6–23)
CO2: 28 mEq/L (ref 19–32)
Creatinine, Ser: 0.64 mg/dL (ref 0.40–1.20)
Glucose, Bld: 99 mg/dL (ref 70–99)
Sodium: 137 mEq/L (ref 135–145)
Total Bilirubin: 0.4 mg/dL (ref 0.3–1.2)
Total Protein: 7.1 g/dL (ref 6.0–8.3)

## 2007-11-23 LAB — CBC WITH DIFFERENTIAL/PLATELET
Eosinophils Absolute: 0.1 10*3/uL (ref 0.0–0.5)
LYMPH%: 30.4 % (ref 14.0–48.0)
MONO#: 0.6 10*3/uL (ref 0.1–0.9)
NEUT#: 2.4 10*3/uL (ref 1.5–6.5)
Platelets: 188 10*3/uL (ref 145–400)
RBC: 3.47 10*6/uL — ABNORMAL LOW (ref 3.70–5.32)
RDW: 11.9 % (ref 11.3–14.5)
WBC: 4.5 10*3/uL (ref 3.9–10.0)

## 2007-11-24 LAB — VITAMIN D 25 HYDROXY (VIT D DEFICIENCY, FRACTURES): Vit D, 25-Hydroxy: 32 ng/mL (ref 30–89)

## 2007-12-13 ENCOUNTER — Encounter: Payer: Self-pay | Admitting: Oncology

## 2007-12-13 ENCOUNTER — Ambulatory Visit: Admission: RE | Admit: 2007-12-13 | Discharge: 2007-12-13 | Payer: Self-pay | Admitting: Oncology

## 2007-12-13 ENCOUNTER — Ambulatory Visit: Payer: Self-pay | Admitting: Cardiology

## 2007-12-15 LAB — COMPREHENSIVE METABOLIC PANEL
AST: 14 U/L (ref 0–37)
BUN: 20 mg/dL (ref 6–23)
CO2: 28 mEq/L (ref 19–32)
Calcium: 9.6 mg/dL (ref 8.4–10.5)
Chloride: 102 mEq/L (ref 96–112)
Creatinine, Ser: 0.57 mg/dL (ref 0.40–1.20)
Total Bilirubin: 0.4 mg/dL (ref 0.3–1.2)

## 2007-12-15 LAB — CBC WITH DIFFERENTIAL/PLATELET
BASO%: 1.1 % (ref 0.0–2.0)
Basophils Absolute: 0.1 10*3/uL (ref 0.0–0.1)
EOS%: 2.3 % (ref 0.0–7.0)
LYMPH%: 28.1 % (ref 14.0–48.0)
MCH: 34.7 pg — ABNORMAL HIGH (ref 26.0–34.0)
MCHC: 35.1 g/dL (ref 32.0–36.0)
NEUT#: 2.9 10*3/uL (ref 1.5–6.5)
NEUT%: 59.8 % (ref 39.6–76.8)
WBC: 4.9 10*3/uL (ref 3.9–10.0)
lymph#: 1.4 10*3/uL (ref 0.9–3.3)

## 2007-12-15 LAB — CANCER ANTIGEN 27.29: CA 27.29: 9 U/mL (ref 0–39)

## 2007-12-31 DIAGNOSIS — C50919 Malignant neoplasm of unspecified site of unspecified female breast: Secondary | ICD-10-CM

## 2007-12-31 HISTORY — DX: Malignant neoplasm of unspecified site of unspecified female breast: C50.919

## 2008-01-03 ENCOUNTER — Ambulatory Visit: Payer: Self-pay | Admitting: Oncology

## 2008-01-05 LAB — CBC WITH DIFFERENTIAL/PLATELET
BASO%: 1 % (ref 0.0–2.0)
Basophils Absolute: 0 10*3/uL (ref 0.0–0.1)
EOS%: 3.1 % (ref 0.0–7.0)
HCT: 35.3 % (ref 34.8–46.6)
HGB: 12.3 g/dL (ref 11.6–15.9)
LYMPH%: 34 % (ref 14.0–48.0)
MCH: 35.2 pg — ABNORMAL HIGH (ref 26.0–34.0)
MCHC: 34.9 g/dL (ref 32.0–36.0)
MCV: 101 fL (ref 81.0–101.0)
MONO%: 11 % (ref 0.0–13.0)
NEUT%: 51 % (ref 39.6–76.8)
Platelets: 181 10*3/uL (ref 145–400)
lymph#: 1.7 10*3/uL (ref 0.9–3.3)

## 2008-01-05 LAB — COMPREHENSIVE METABOLIC PANEL
Albumin: 4.5 g/dL (ref 3.5–5.2)
BUN: 19 mg/dL (ref 6–23)
CO2: 24 mEq/L (ref 19–32)
Glucose, Bld: 105 mg/dL — ABNORMAL HIGH (ref 70–99)
Sodium: 139 mEq/L (ref 135–145)
Total Bilirubin: 0.3 mg/dL (ref 0.3–1.2)
Total Protein: 7.1 g/dL (ref 6.0–8.3)

## 2008-01-24 LAB — CBC WITH DIFFERENTIAL/PLATELET
EOS%: 2.6 % (ref 0.0–7.0)
MCH: 35.2 pg — ABNORMAL HIGH (ref 26.0–34.0)
MCV: 99.4 fL (ref 81.0–101.0)
MONO%: 10.5 % (ref 0.0–13.0)
RBC: 3.54 10*6/uL — ABNORMAL LOW (ref 3.70–5.32)
RDW: 11.7 % (ref 11.3–14.5)

## 2008-01-24 LAB — COMPREHENSIVE METABOLIC PANEL
AST: 15 U/L (ref 0–37)
Albumin: 4.4 g/dL (ref 3.5–5.2)
Alkaline Phosphatase: 49 U/L (ref 39–117)
Glucose, Bld: 128 mg/dL — ABNORMAL HIGH (ref 70–99)
Potassium: 3.4 mEq/L — ABNORMAL LOW (ref 3.5–5.3)
Sodium: 138 mEq/L (ref 135–145)
Total Protein: 6.7 g/dL (ref 6.0–8.3)

## 2008-02-14 LAB — CBC WITH DIFFERENTIAL/PLATELET
BASO%: 1.1 % (ref 0.0–2.0)
Basophils Absolute: 0.1 10*3/uL (ref 0.0–0.1)
HCT: 35.6 % (ref 34.8–46.6)
HGB: 12.5 g/dL (ref 11.6–15.9)
LYMPH%: 34.6 % (ref 14.0–48.0)
MCHC: 35 g/dL (ref 32.0–36.0)
MONO#: 0.4 10*3/uL (ref 0.1–0.9)
NEUT%: 53.8 % (ref 39.6–76.8)
Platelets: 191 10*3/uL (ref 145–400)
WBC: 5.3 10*3/uL (ref 3.9–10.0)

## 2008-02-14 LAB — COMPREHENSIVE METABOLIC PANEL
ALT: 14 U/L (ref 0–35)
Alkaline Phosphatase: 49 U/L (ref 39–117)
Creatinine, Ser: 0.63 mg/dL (ref 0.40–1.20)
Sodium: 139 mEq/L (ref 135–145)
Total Bilirubin: 0.3 mg/dL (ref 0.3–1.2)
Total Protein: 7.2 g/dL (ref 6.0–8.3)

## 2008-03-04 ENCOUNTER — Ambulatory Visit: Payer: Self-pay | Admitting: Oncology

## 2008-03-06 LAB — COMPREHENSIVE METABOLIC PANEL
AST: 14 U/L (ref 0–37)
CO2: 27 mEq/L (ref 19–32)
Calcium: 8.9 mg/dL (ref 8.4–10.5)
Chloride: 100 mEq/L (ref 96–112)
Creatinine, Ser: 0.58 mg/dL (ref 0.40–1.20)
Sodium: 138 mEq/L (ref 135–145)
Total Bilirubin: 0.3 mg/dL (ref 0.3–1.2)

## 2008-03-06 LAB — CBC WITH DIFFERENTIAL/PLATELET
BASO%: 0.3 % (ref 0.0–2.0)
Basophils Absolute: 0 10*3/uL (ref 0.0–0.1)
EOS%: 2.5 % (ref 0.0–7.0)
LYMPH%: 30.6 % (ref 14.0–48.0)
MONO%: 8.4 % (ref 0.0–13.0)
NEUT#: 3.1 10*3/uL (ref 1.5–6.5)
Platelets: 196 10*3/uL (ref 145–400)
WBC: 5.3 10*3/uL (ref 3.9–10.0)
lymph#: 1.6 10*3/uL (ref 0.9–3.3)

## 2008-03-12 ENCOUNTER — Ambulatory Visit: Payer: Self-pay | Admitting: Internal Medicine

## 2008-03-12 ENCOUNTER — Ambulatory Visit: Admission: RE | Admit: 2008-03-12 | Discharge: 2008-03-12 | Payer: Self-pay | Admitting: Oncology

## 2008-03-12 ENCOUNTER — Encounter: Payer: Self-pay | Admitting: Oncology

## 2008-03-27 LAB — CBC WITH DIFFERENTIAL/PLATELET
BASO%: 0.3 % (ref 0.0–2.0)
Eosinophils Absolute: 0.1 10*3/uL (ref 0.0–0.5)
HCT: 36.3 % (ref 34.8–46.6)
HGB: 12.8 g/dL (ref 11.6–15.9)
MONO%: 5.6 % (ref 0.0–13.0)
NEUT#: 3.1 10*3/uL (ref 1.5–6.5)
NEUT%: 57.9 % (ref 39.6–76.8)
Platelets: 190 10*3/uL (ref 145–400)
RBC: 3.51 10*6/uL — ABNORMAL LOW (ref 3.70–5.32)
RDW: 13 % (ref 11.3–14.5)

## 2008-03-28 LAB — COMPREHENSIVE METABOLIC PANEL
AST: 16 U/L (ref 0–37)
Alkaline Phosphatase: 50 U/L (ref 39–117)
BUN: 24 mg/dL — ABNORMAL HIGH (ref 6–23)
Calcium: 9 mg/dL (ref 8.4–10.5)
Chloride: 97 mEq/L (ref 96–112)
Creatinine, Ser: 0.59 mg/dL (ref 0.40–1.20)
Sodium: 133 mEq/L — ABNORMAL LOW (ref 135–145)
Total Protein: 6.9 g/dL (ref 6.0–8.3)

## 2008-03-28 LAB — VITAMIN D 25 HYDROXY (VIT D DEFICIENCY, FRACTURES): Vit D, 25-Hydroxy: 43 ng/mL (ref 30–89)

## 2008-05-02 ENCOUNTER — Ambulatory Visit: Payer: Self-pay | Admitting: Oncology

## 2008-07-26 ENCOUNTER — Ambulatory Visit: Payer: Self-pay | Admitting: Oncology

## 2008-08-08 ENCOUNTER — Ambulatory Visit (HOSPITAL_COMMUNITY): Admission: RE | Admit: 2008-08-08 | Discharge: 2008-08-08 | Payer: Self-pay | Admitting: Internal Medicine

## 2008-10-01 ENCOUNTER — Ambulatory Visit: Payer: Self-pay | Admitting: Oncology

## 2008-10-03 ENCOUNTER — Ambulatory Visit (HOSPITAL_COMMUNITY): Admission: RE | Admit: 2008-10-03 | Discharge: 2008-10-03 | Payer: Self-pay | Admitting: Family Medicine

## 2008-10-03 LAB — COMPREHENSIVE METABOLIC PANEL
Albumin: 4.3 g/dL (ref 3.5–5.2)
BUN: 11 mg/dL (ref 6–23)
Calcium: 9.3 mg/dL (ref 8.4–10.5)
Chloride: 95 mEq/L — ABNORMAL LOW (ref 96–112)
Creatinine, Ser: 0.59 mg/dL (ref 0.40–1.20)
Glucose, Bld: 123 mg/dL — ABNORMAL HIGH (ref 70–99)
Potassium: 3.2 mEq/L — ABNORMAL LOW (ref 3.5–5.3)
Sodium: 133 mEq/L — ABNORMAL LOW (ref 135–145)

## 2008-10-03 LAB — CBC WITH DIFFERENTIAL/PLATELET
HGB: 12.9 g/dL (ref 11.6–15.9)
MCH: 35.1 pg — ABNORMAL HIGH (ref 25.1–34.0)
MCHC: 34.1 g/dL (ref 31.5–36.0)
MCV: 102.9 fL — ABNORMAL HIGH (ref 79.5–101.0)
MONO#: 0.3 10*3/uL (ref 0.1–0.9)
MONO%: 6.9 % (ref 0.0–14.0)
NEUT%: 64 % (ref 38.4–76.8)
Platelets: 193 10*3/uL (ref 145–400)
RBC: 3.69 10*6/uL — ABNORMAL LOW (ref 3.70–5.45)
RDW: 13.1 % (ref 11.2–14.5)
lymph#: 1.3 10*3/uL (ref 0.9–3.3)

## 2008-10-03 LAB — LACTATE DEHYDROGENASE: LDH: 161 U/L (ref 94–250)

## 2009-04-02 ENCOUNTER — Ambulatory Visit: Payer: Self-pay | Admitting: Oncology

## 2009-04-04 LAB — CBC WITH DIFFERENTIAL/PLATELET
BASO%: 0.5 % (ref 0.0–2.0)
HCT: 37.2 % (ref 34.8–46.6)
HGB: 12.8 g/dL (ref 11.6–15.9)
MCV: 105 fL — ABNORMAL HIGH (ref 79.5–101.0)
MONO%: 9.9 % (ref 0.0–14.0)
NEUT#: 3.8 10*3/uL (ref 1.5–6.5)
NEUT%: 67.6 % (ref 38.4–76.8)
RBC: 3.54 10*6/uL — ABNORMAL LOW (ref 3.70–5.45)
lymph#: 1.1 10*3/uL (ref 0.9–3.3)

## 2009-04-04 LAB — LACTATE DEHYDROGENASE: LDH: 174 U/L (ref 94–250)

## 2009-04-04 LAB — COMPREHENSIVE METABOLIC PANEL
Albumin: 3.9 g/dL (ref 3.5–5.2)
Alkaline Phosphatase: 62 U/L (ref 39–117)
CO2: 30 mEq/L (ref 19–32)
Calcium: 9.1 mg/dL (ref 8.4–10.5)
Creatinine, Ser: 0.51 mg/dL (ref 0.40–1.20)
Potassium: 3.9 mEq/L (ref 3.5–5.3)
Total Bilirubin: 0.5 mg/dL (ref 0.3–1.2)
Total Protein: 7.1 g/dL (ref 6.0–8.3)

## 2009-06-04 ENCOUNTER — Ambulatory Visit (HOSPITAL_BASED_OUTPATIENT_CLINIC_OR_DEPARTMENT_OTHER): Admission: RE | Admit: 2009-06-04 | Discharge: 2009-06-04 | Payer: Self-pay | Admitting: Surgery

## 2009-10-14 ENCOUNTER — Ambulatory Visit: Payer: Self-pay | Admitting: Oncology

## 2009-10-14 LAB — COMPREHENSIVE METABOLIC PANEL
Albumin: 4.5 g/dL (ref 3.5–5.2)
Alkaline Phosphatase: 68 U/L (ref 39–117)
BUN: 16 mg/dL (ref 6–23)
CO2: 30 mEq/L (ref 19–32)
Chloride: 101 mEq/L (ref 96–112)
Creatinine, Ser: 0.62 mg/dL (ref 0.40–1.20)
Glucose, Bld: 102 mg/dL — ABNORMAL HIGH (ref 70–99)
Potassium: 4 mEq/L (ref 3.5–5.3)

## 2009-10-14 LAB — LACTATE DEHYDROGENASE: LDH: 166 U/L (ref 94–250)

## 2009-10-14 LAB — CBC WITH DIFFERENTIAL/PLATELET
BASO%: 0.5 % (ref 0.0–2.0)
Basophils Absolute: 0 10*3/uL (ref 0.0–0.1)
EOS%: 2.2 % (ref 0.0–7.0)
Eosinophils Absolute: 0.1 10*3/uL (ref 0.0–0.5)
HGB: 12.5 g/dL (ref 11.6–15.9)
LYMPH%: 30.1 % (ref 14.0–49.7)
MCV: 102.9 fL — ABNORMAL HIGH (ref 79.5–101.0)
MONO#: 0.4 10*3/uL (ref 0.1–0.9)
MONO%: 11.2 % (ref 0.0–14.0)
RDW: 12.4 % (ref 11.2–14.5)
WBC: 3.6 10*3/uL — ABNORMAL LOW (ref 3.9–10.3)
lymph#: 1.1 10*3/uL (ref 0.9–3.3)

## 2010-04-16 ENCOUNTER — Encounter (HOSPITAL_BASED_OUTPATIENT_CLINIC_OR_DEPARTMENT_OTHER): Payer: Medicare Other | Admitting: Oncology

## 2010-04-16 ENCOUNTER — Other Ambulatory Visit: Payer: Self-pay | Admitting: Oncology

## 2010-04-16 DIAGNOSIS — Z5111 Encounter for antineoplastic chemotherapy: Secondary | ICD-10-CM

## 2010-04-16 DIAGNOSIS — Z170 Estrogen receptor positive status [ER+]: Secondary | ICD-10-CM

## 2010-04-16 LAB — CBC WITH DIFFERENTIAL/PLATELET
EOS%: 2.6 % (ref 0.0–7.0)
Eosinophils Absolute: 0.1 10*3/uL (ref 0.0–0.5)
HCT: 36.1 % (ref 34.8–46.6)
HGB: 12.2 g/dL (ref 11.6–15.9)
MCH: 34.3 pg — ABNORMAL HIGH (ref 25.1–34.0)
MCHC: 33.8 g/dL (ref 31.5–36.0)
MCV: 101.5 fL — ABNORMAL HIGH (ref 79.5–101.0)
NEUT#: 2.7 10*3/uL (ref 1.5–6.5)
Platelets: 224 10*3/uL (ref 145–400)
RDW: 12.8 % (ref 11.2–14.5)
WBC: 4.4 10*3/uL (ref 3.9–10.3)

## 2010-04-16 LAB — COMPREHENSIVE METABOLIC PANEL
ALT: 17 U/L (ref 0–35)
AST: 19 U/L (ref 0–37)
Creatinine, Ser: 0.69 mg/dL (ref 0.40–1.20)
Glucose, Bld: 107 mg/dL — ABNORMAL HIGH (ref 70–99)
Total Protein: 7.1 g/dL (ref 6.0–8.3)

## 2010-04-17 LAB — VITAMIN D 25 HYDROXY (VIT D DEFICIENCY, FRACTURES): Vit D, 25-Hydroxy: 41 ng/mL (ref 30–89)

## 2010-04-23 ENCOUNTER — Encounter (HOSPITAL_BASED_OUTPATIENT_CLINIC_OR_DEPARTMENT_OTHER): Payer: Medicare Other | Admitting: Oncology

## 2010-04-23 DIAGNOSIS — Z170 Estrogen receptor positive status [ER+]: Secondary | ICD-10-CM

## 2010-04-23 DIAGNOSIS — C50919 Malignant neoplasm of unspecified site of unspecified female breast: Secondary | ICD-10-CM

## 2010-06-08 LAB — CREATININE, SERUM: GFR calc Af Amer: >60 mL/min (ref >60)

## 2010-07-14 NOTE — Op Note (Signed)
NAME:  Cassandra Webb, Cassandra Webb NO.:  [id number]   MEDICAL RECORD NO.:  [id number]          PATIENT TYPE:  AMB   LOCATION:  DSC                          FACILITY:  [facility]   PHYSICIAN:  Currie Paris, M.D.DATE OF BIRTH:  1933-09-18   DATE OF PROCEDURE:  DATE OF DISCHARGE:                               OPERATIVE REPORT   PREOPERATIVE DIAGNOSIS:  Carcinoma, right breast upper inner quadrant  status post neoadjuvant chemotherapy.   POSTOPERATIVE DIAGNOSIS:  Carcinoma, right breast upper inner quadrant  status post neoadjuvant chemotherapy.   OPERATION:  Needle local right lumpectomy with blue dye injection,  axillary sentinel lymph node biopsy (four nodes) and MammoSite cavity  evaluation device placement.   SURGEON:  Currie Paris, MD   ANESTHESIA:  General.   CLINICAL HISTORY:  This is a 77 year old lady with a breast cancer,  clinical stage I in upper inner quadrant of the right breast, possibly  clinical stage II by size, and she underwent preoperative neoadjuvant  therapy.  We were not sure that we would be able to have good coverage  because of the proximity of the tumor originally to the skin.  However,  the patient strongly wished to have one of these if possible, so  decision was to see if we look like we had negative margins and could  get appropriate skin coverage that we would at least put the mammary CED  for evaluation.   DESCRIPTION OF PROCEDURE:  The patient was seen in the holding area and  she had no further questions.  The right breast was marked and  identified by both of [place] as the operative side.  Her needle localization  guidewire had already been placed and I reviewed those films.   The patient was taken to the operating room after satisfactory general  anesthesia had been obtained.  The breast was prepped with some alcohol  and the time-out occurred.  I then injected 5 mL of dilute methylene  blue dye subareolarly and massaged that  in.   The entire breast was then prepped and draped.  A second time-out was  done.   Using Neoprobe, I identified a hot area in the axilla, made a transverse  incision, divided the subcutaneous tissues, and identified a blue  lymphatic which was traced to a blue lymph node.  There was actually a  cluster of about three nodes right here all of which had activity with  the hot blue, hottest one being also the largest and the bluest that had  counts of about 2500.  The other two had counts in the several 100.  A  tiny node was identified having a little blue dye coming a just a little  more anterior and this was also taken out.  Once those were out, there  was no palpable adenopathy and we really got no counts above about 50 in  the axilla.  I thought we had adequate sampling.  Small pack was placed  and attention turned to the breast.   The guidewire entered medially and tracked towards the areola.  There  was approximately  a 3-cm distance from the skin to the tumor and I  marked that area of skin.  I made an elliptical incision starting just  lateral to the guidewire entry site and going to the areolar edge and  taking a 1-cm wide piece of skin.  I then curved the incision around the  areola a little bit, so that we could free this tissue up and to make  sure I had a really good lateral margin or subareolar margin.   I divided the breast tissue down at the lateral edge of the wound  manipulating the guidewire and then went down almost to chest wall  leaving a little bit so that we would have a little bit of tissue around  the planned MammoSite and the tumor itself appeared fairly superficial.  I then worked from medial to lateral going down the chest wall and I  could palpate what I felt to be the residual tumor and stayed away from  that, although I was a little closest on the superior margin.  I got  well medial and divided the tissue medial to the tip of the guidewire.  This was  sent for specimen mammograms and appeared to be in the center  of the tissue.  However, because of my palpation of her superior margin  mightbe a little close, I took an extra centimeter of tissue right  there.  I spent several minutes making sure everything was dry.  While  awaiting for pathology, I went ahead and closed the axillary incision  after making sure it was dry using some Marcaine for postop analgesia, 3-  0 Vicryl for Monocryl subcuticular.   Dr. Dierdre Searles called to say that the four sentinel nodes were all negative for  tumor.  I therefore closed a little bit of the skin of the breast  incision right where the curved incision was made for the areola.  I put  the MammoSite balloon CED and blew it up just tested to make sure it  looked like what have adequate space and I thought this was going to be  a little close but that it might be successful.   I then made a counter incision parallel to but in the inferior quadrant  of the breast and used the trocar device to make a tract and then put  the MammoSite CED in that tract.  I then closed the breast with two  layers in the subcu to try to get a good closure and at least a 1 cm  skin to balloon distance.  The skin was closed with 4-0 Monocryl  subcuticular.   I then blew the balloon up to 35 mL.  Using ultrasound, it appeared to  have 12-18 mm skin to balloon distance all around.  I could not really  evaluate the chest wall closeness, but I thought it was well over the  pectoralis major muscle.  This did produce some increased size of the  breast simply because we took out less than 35 mL, but again this  appeared to have a good closure.   Steri-Strips were applied and sterile dressings with an Ace wrap for  little compression.   The patient tolerated the procedure well and there no complications.  All counts were correct.      Currie Paris, M.D.  Electronically Signed     CJS/MEDQ  D:  07/11/2007  T:  07/12/2007   Job:  [id number]   cc:   Drucie Opitz  Pierce Crane, MD  Maryln Gottron, M.D.

## 2010-07-14 NOTE — Op Note (Signed)
NAME:  Cassandra Webb, FAHY NO.:  [id number]   MEDICAL RECORD NO.:  [id number]          PATIENT TYPE:  AMB   LOCATION:  DSC                          FACILITY:  [facility]   PHYSICIAN:  Currie Paris, M.D.DATE OF BIRTH:  1933/04/04   DATE OF PROCEDURE:  02/22/2007  DATE OF DISCHARGE:                               OPERATIVE REPORT   OFFICE MEDICAL RECORD NUMBER:  [id number].   PREOPERATIVE DIAGNOSIS:  Carcinoma of the breast.   POSTOPERATIVE DIAGNOSIS:  Carcinoma of the breast.   OPERATION:  Port-A-Cath placement.   SURGEON:  Currie Paris, M.D.   ANESTHESIA:  MAC.   CLINICAL HISTORY:  This patient is getting ready to have neoadjuvant  chemotherapy for a right breast cancer.  She needed Port-A-Cath  placement for IV chemotherapy.   DESCRIPTION OF PROCEDURE:  The patient was seen in the holding area and  she had no further questions.  We confirmed Port-A-Cath placement as the  planned procedure and went over again the risks and complications as  well as postoperative care.   The patient was taken to the operating room after satisfactory IV  sedation.  The upper chest and lower neck area bilaterally was prepped  and draped as a sterile field.  The time-out was performed.   The left infraclavicular area was infiltrated with 1% Xylocaine and the  left subclavian vein entered on the initial attempt and the guidewire  threaded in the superior vena cava checking with fluoro.   Additional local was infiltrated over the anterior chest wall and a  transverse incision made and a pocket formed with cautery.  The Port-A-  Cath tubing was then pulled from the reservoir site into the guidewire  site.  The guidewire site was dilated once with the dilator and then the  dilator and guidewire were removed and the Port-A-Cath tubing threaded  to approximately 22 cm.  The peel-away sheath was removed.  The tubing  aspirated and flushed easily.   Using fluoro, with a  little contrast in the tubing, I could see that the  tube appeared to be in the right atrium or perhaps the right ventricle.  It was backed up to 18 cm were it appeared to be midway between the  bifurcation of the trachea superiorly and the atrium inferiorly.  Again,  it aspirated and flushed easily.   The reservoir was flushed, attached and the locking mechanism engaged.  The reservoir was then sutured to the fascia with 2-0 Prolene.  Final  fluoro was made to make sure we had good positioning and everything  appeared okay  with no kinks.  The catheter was flushed with dilute heparin followed by  concentrated heparin.  The incision was closed with 3-0 Vicryl, 4-0  Monocryl subcuticular and Dermabond.   The patient tolerated the procedure well and there no complications.      Currie Paris, M.D.  Electronically Signed     CJS/MEDQ  D:  02/22/2007  T:  02/22/2007  Job:  [id number]   cc:   Pierce Crane, MD

## 2010-07-17 NOTE — H&P (Signed)
NAME:  Cassandra Webb, Cassandra Webb NO.:  [id number]   MEDICAL RECORD NO.:  [id number]          PATIENT TYPE:  EMS   LOCATION:  ED                           [facility]ACILITY[facility]   PHYSICIAN:  Georges Lynch. Gioffre, M.D.DATE OF BIRTH:  06-Dec-1933   DATE OF ADMISSION:  11/10/2004  DATE OF DISCHARGE:                                HISTORY & PHYSICAL   CHIEF COMPLAINT:  Low back pain.   HISTORY OF PRESENT ILLNESS:  The patient has had low back pain ever since a  fall back in May 2006.  She has had symptoms down an L5 nerve root.  CT  myelogram demonstrates spinal stenosis and HNP of L4/L5, and she has planned  to have a decompression of microdiskectomy on October 10, 2004, by Dr.  Darrelyn Hillock.  The patient has allergies to Lyrica and Guaifenesin PSE.   MEDICATIONS:  1.  Altace 10 mg b.i.d.  2.  Nadolol 40 mg daily.  3.  Hydrochlorothiazide 25 mg daily.  4.  Sular 10 mg daily.  5.  Vicodin 5 mg one tablet q.4-6 h p.r.n. pain.  6.  Ultram 50 mg one tablet q.4-6 h p.r.n. pain.   PAST MEDICAL HISTORY:  1.  Migraines.  2.  Hypertension.  3.  Constipation.  4.  Hemorrhoids.   SOCIAL HISTORY:  The patient is married.  She is president of [employer].  Does not smoke.  No alcohol use, and her primary care  physician is Dr Debroah Loop.   FAMILY HISTORY:  Coronary artery disease, hypertension, cancer and CVA.   REVIEW OF SYSTEMS:  History of uterine prolapse, and she is also hard of  hearing and has a history of constipation.   PHYSICAL EXAMINATION:  VITAL SIGNS:  Pulse 84, respirations 18, blood  pressure 152/82.  GENERAL APPEARANCE:  The patient is a healthy appearing 77 year old female  in no acute distress, pleasant.  Mood and affect are alert and oriented x3.  HEENT:  Cranial nerves II-XII grossly intact.  NECK:  Full range of motion with no tenderness.  CHEST:  Active breath sounds bilaterally with no wheezes, rhonchi or rales.  HEART:  Regular rate and rhythm and no  murmur.  ABDOMEN:  Nontender, nondistended with active bowel sounds and no pulsatile  masses felt.  EXTREMITIES:  Decreased sensation of the older distribution of the right  upper extremity.  The patient has grip strength, biceps, triceps,  quadriceps, hamstrings, plantar and dorsiflexion all 5/5 bilaterally.  Reflexes are only 1+ bilaterally.  She has no signs of skin rashes and no  pedal edema.  CT of the myelogram demonstrates spinal stenosis in the H&P of  L4-5.   IMPRESSION:  Spinal stenosis L4-5 and HNP of L4-5.   PLAN:  Have a decompression of L4-5 and a microdiskectomy of L4-5 by Dr.  Ranee Gosselin on November 10, 2004.      Thomas B. Dixon, P.A.    ______________________________  Georges Lynch Darrelyn Hillock, M.D.    TBD/MEDQ  D:  11/04/2004  T:  11/04/2004  Job:  [id number]

## 2010-12-04 LAB — BASIC METABOLIC PANEL
Chloride: 97
Creatinine, Ser: 0.66
GFR calc Af Amer: >60
Potassium: 4.1

## 2010-12-04 LAB — POCT HEMOGLOBIN-HEMACUE
Hemoglobin: 14.2
Operator id: 12362

## 2011-03-05 DIAGNOSIS — H5710 Ocular pain, unspecified eye: Secondary | ICD-10-CM | POA: Diagnosis not present

## 2011-03-05 DIAGNOSIS — Z961 Presence of intraocular lens: Secondary | ICD-10-CM | POA: Diagnosis not present

## 2011-03-05 DIAGNOSIS — H16219 Exposure keratoconjunctivitis, unspecified eye: Secondary | ICD-10-CM | POA: Diagnosis not present

## 2011-04-06 DIAGNOSIS — M25559 Pain in unspecified hip: Secondary | ICD-10-CM | POA: Diagnosis not present

## 2011-04-06 DIAGNOSIS — M79609 Pain in unspecified limb: Secondary | ICD-10-CM | POA: Diagnosis not present

## 2011-04-06 DIAGNOSIS — Z853 Personal history of malignant neoplasm of breast: Secondary | ICD-10-CM | POA: Diagnosis not present

## 2011-04-16 DIAGNOSIS — S79919A Unspecified injury of unspecified hip, initial encounter: Secondary | ICD-10-CM | POA: Diagnosis not present

## 2011-04-16 DIAGNOSIS — M25559 Pain in unspecified hip: Secondary | ICD-10-CM | POA: Diagnosis not present

## 2011-05-25 DIAGNOSIS — Z853 Personal history of malignant neoplasm of breast: Secondary | ICD-10-CM | POA: Diagnosis not present

## 2011-05-25 DIAGNOSIS — Z13820 Encounter for screening for osteoporosis: Secondary | ICD-10-CM | POA: Diagnosis not present

## 2011-05-25 DIAGNOSIS — N6489 Other specified disorders of breast: Secondary | ICD-10-CM | POA: Diagnosis not present

## 2011-05-31 DIAGNOSIS — M1710 Unilateral primary osteoarthritis, unspecified knee: Secondary | ICD-10-CM | POA: Diagnosis not present

## 2011-05-31 DIAGNOSIS — M2240 Chondromalacia patellae, unspecified knee: Secondary | ICD-10-CM | POA: Diagnosis not present

## 2011-06-07 ENCOUNTER — Other Ambulatory Visit: Payer: Self-pay | Admitting: *Deleted

## 2011-06-07 MED ORDER — LETROZOLE 2.5 MG PO TABS: 2.5000 mg | ORAL_TABLET | Freq: Every day | ORAL | Status: AC

## 2011-06-07 NOTE — Telephone Encounter (Signed)
lled patient to made the patient an appointment for 06-2011 patient was questioning why she needs to come in and see dr.rubin will forward this information to [place] the nurse

## 2011-06-10 DIAGNOSIS — M25569 Pain in unspecified knee: Secondary | ICD-10-CM | POA: Diagnosis not present

## 2011-06-24 DIAGNOSIS — M25569 Pain in unspecified knee: Secondary | ICD-10-CM | POA: Diagnosis not present

## 2011-06-28 ENCOUNTER — Telehealth: Payer: Self-pay | Admitting: *Deleted

## 2011-06-28 NOTE — Telephone Encounter (Signed)
patient just requested to cancel the appointment until she see's the knee surgeon patient stated this over the phone on 06-28-2011

## 2011-07-01 ENCOUNTER — Other Ambulatory Visit: Payer: Medicare Other | Admitting: Lab

## 2011-07-01 ENCOUNTER — Ambulatory Visit: Payer: Medicare Other | Admitting: Oncology

## 2011-07-13 DIAGNOSIS — M1710 Unilateral primary osteoarthritis, unspecified knee: Secondary | ICD-10-CM | POA: Diagnosis not present

## 2011-08-01 DIAGNOSIS — I10 Essential (primary) hypertension: Secondary | ICD-10-CM | POA: Diagnosis not present

## 2011-08-01 DIAGNOSIS — C50919 Malignant neoplasm of unspecified site of unspecified female breast: Secondary | ICD-10-CM | POA: Diagnosis not present

## 2011-08-01 DIAGNOSIS — E785 Hyperlipidemia, unspecified: Secondary | ICD-10-CM | POA: Diagnosis not present

## 2011-08-10 DIAGNOSIS — E559 Vitamin D deficiency, unspecified: Secondary | ICD-10-CM | POA: Diagnosis not present

## 2011-08-10 DIAGNOSIS — M1710 Unilateral primary osteoarthritis, unspecified knee: Secondary | ICD-10-CM | POA: Diagnosis not present

## 2011-08-10 DIAGNOSIS — D649 Anemia, unspecified: Secondary | ICD-10-CM | POA: Diagnosis not present

## 2011-08-10 DIAGNOSIS — R5383 Other fatigue: Secondary | ICD-10-CM | POA: Diagnosis not present

## 2011-08-10 DIAGNOSIS — E785 Hyperlipidemia, unspecified: Secondary | ICD-10-CM | POA: Diagnosis not present

## 2011-08-10 DIAGNOSIS — N390 Urinary tract infection, site not specified: Secondary | ICD-10-CM | POA: Diagnosis not present

## 2011-08-24 DIAGNOSIS — M6281 Muscle weakness (generalized): Secondary | ICD-10-CM | POA: Diagnosis not present

## 2011-08-24 DIAGNOSIS — M25569 Pain in unspecified knee: Secondary | ICD-10-CM | POA: Diagnosis not present

## 2011-08-24 DIAGNOSIS — R279 Unspecified lack of coordination: Secondary | ICD-10-CM | POA: Diagnosis not present

## 2011-08-24 DIAGNOSIS — R269 Unspecified abnormalities of gait and mobility: Secondary | ICD-10-CM | POA: Diagnosis not present

## 2011-08-25 DIAGNOSIS — R279 Unspecified lack of coordination: Secondary | ICD-10-CM | POA: Diagnosis not present

## 2011-08-25 DIAGNOSIS — M6281 Muscle weakness (generalized): Secondary | ICD-10-CM | POA: Diagnosis not present

## 2011-08-25 DIAGNOSIS — M25569 Pain in unspecified knee: Secondary | ICD-10-CM | POA: Diagnosis not present

## 2011-08-25 DIAGNOSIS — R269 Unspecified abnormalities of gait and mobility: Secondary | ICD-10-CM | POA: Diagnosis not present

## 2011-09-15 DIAGNOSIS — R269 Unspecified abnormalities of gait and mobility: Secondary | ICD-10-CM | POA: Diagnosis not present

## 2011-09-15 DIAGNOSIS — M25569 Pain in unspecified knee: Secondary | ICD-10-CM | POA: Diagnosis not present

## 2011-09-15 DIAGNOSIS — R279 Unspecified lack of coordination: Secondary | ICD-10-CM | POA: Diagnosis not present

## 2012-03-07 DIAGNOSIS — Z961 Presence of intraocular lens: Secondary | ICD-10-CM | POA: Diagnosis not present

## 2012-03-07 DIAGNOSIS — H04129 Dry eye syndrome of unspecified lacrimal gland: Secondary | ICD-10-CM | POA: Diagnosis not present

## 2012-03-07 DIAGNOSIS — H26499 Other secondary cataract, unspecified eye: Secondary | ICD-10-CM | POA: Diagnosis not present

## 2012-05-30 ENCOUNTER — Other Ambulatory Visit: Payer: Self-pay | Admitting: Oncology

## 2012-05-30 DIAGNOSIS — C50911 Malignant neoplasm of unspecified site of right female breast: Secondary | ICD-10-CM

## 2012-05-30 DIAGNOSIS — C50211 Malignant neoplasm of upper-inner quadrant of right female breast: Secondary | ICD-10-CM

## 2012-05-31 ENCOUNTER — Telehealth: Payer: Self-pay | Admitting: *Deleted

## 2012-05-31 ENCOUNTER — Encounter: Payer: Self-pay | Admitting: Oncology

## 2012-05-31 NOTE — Telephone Encounter (Signed)
Returned pts call and confirmed 06/26/12 appt w/ her.  Mailed letter & calendar to pt.

## 2012-06-23 ENCOUNTER — Telehealth: Payer: Self-pay | Admitting: *Deleted

## 2012-06-23 NOTE — Telephone Encounter (Signed)
Received a call from Helen at [facility] in question if [facility] had any paperwork to be filled out before she needed to be seen for this visit.  Informed her yes, there is a 7 pg packet that we would like to have filled out.  She asked if I could fax over to [facility] so they could fill and and bring with her on 4/28.  I faxed packet to [phone] as requested.

## 2012-06-26 ENCOUNTER — Encounter: Payer: Self-pay | Admitting: Family

## 2012-06-26 ENCOUNTER — Ambulatory Visit (HOSPITAL_BASED_OUTPATIENT_CLINIC_OR_DEPARTMENT_OTHER): Payer: Medicare Other | Admitting: Family

## 2012-06-26 VITALS — BP 144/83 | HR 68 | Temp 98.4°F | Resp 20 | Ht 61.0 in | Wt 146.0 lb

## 2012-06-26 DIAGNOSIS — M899 Disorder of bone, unspecified: Secondary | ICD-10-CM | POA: Diagnosis not present

## 2012-06-26 DIAGNOSIS — Z853 Personal history of malignant neoplasm of breast: Secondary | ICD-10-CM

## 2012-06-26 DIAGNOSIS — C50919 Malignant neoplasm of unspecified site of unspecified female breast: Secondary | ICD-10-CM | POA: Insufficient documentation

## 2012-06-26 DIAGNOSIS — M949 Disorder of cartilage, unspecified: Secondary | ICD-10-CM | POA: Diagnosis not present

## 2012-06-26 DIAGNOSIS — C50211 Malignant neoplasm of upper-inner quadrant of right female breast: Secondary | ICD-10-CM

## 2012-06-26 DIAGNOSIS — C50911 Malignant neoplasm of unspecified site of right female breast: Secondary | ICD-10-CM

## 2012-06-26 MED ORDER — LETROZOLE 2.5 MG PO TABS: 2.5000 mg | ORAL_TABLET | Freq: Every day | ORAL | Status: DC

## 2012-06-26 NOTE — Progress Notes (Signed)
[facility]  [phone] [phone]) [phone]  OFFICE PROGRESS NOTE   ID: Cassandra Webb   DOB: 1934-01-28  MR#: [id number]  Cassandra Webb#:[id number]   PCP: Cassandra Opitz, MD SU: Cyndia Bent, M.D. RAD ONC: Cassandra Webb, M.D.   HISTORY OF PRESENT ILLNESS: From Dr. Theron Cassandra Webb's New Patient Evaluation Note dated 02/01/2007: "This is a delightful 80 year old woman from [facility] referred by Dr. Jamey Webb for evaluation and treatment of breast cancer.    This woman has been in good health. She does undergo annual screening mammography.  She did detect a mass in her breast in October of 2008.  An initial mammogram in [facility] showed a newly circumcised right breast mass measuring about 18 mm in greatest dimension.  Further evaluation by ultrasound showed this to be a lobulated hypoechoic partly shadow of a mass measuring 15 x 10 x 14 mm.  A biopsy of this mass was performed on 01/17/07.  Final pathology showed features of invasive ductal cancer with high-grade nuclear atypia.  ER positive at 39%, PR negative at 0%.  Proliferative index of 44%. HER-2 was 3+.  She did have some hematoma formation after the biopsy."  Her subsequent history is as detailed below.   INTERVAL HISTORY: Dr. Darnelle Webb and I saw [name] today for follow up of  invasive ductal carcinoma of the right breast.  The patient was last seen by Dr. Donnie Webb on 04/23/2010.   Since her last office visit, the patient has been doing relatively well.  She is a resident of [facility].   She is establishing herself with Dr. Darrall Webb service today.  REVIEW OF SYSTEMS: A 10 point review of systems was completed and is negative except for chronic back pain and the patient states that she has been diagnosed with fibromyalgia by her primary care physician, Dr. Roger Webb.  The patient denies any other symptomatology.   PAST MEDICAL HISTORY: Past Medical History  Diagnosis Date  . Breast  cancer 12/2007  . Tonsillitis   . Carpal tunnel syndrome of right wrist   . Cataracts, bilateral   . Hypertension   . Back pain   . Hyperlipidemia   . History of migraine headaches   . Lumbar herniated disc   Significant for history of migraines.  History of constipation, hypertension, and hemorrhoids.    PAST SURGICAL HISTORY: Past Surgical History  Procedure Laterality Date  . Tonsillectomy    . Ankle surgery Right   . Cesarean section    . Carpal tunnel release Right   . Lumbar disc surgery  2006  . Cataract extraction Bilateral   . Breast lumpectomy with axillary lymph node biopsy    Microdiskectomy 10/10/04.  History of tonsillectomy.  History of right ankle repair.    FAMILY HISTORY Family History  Problem Relation Age of Onset  . Heart Problems Mother   . Seizures Brother   . Heart Problems Maternal Aunt   Mother and father both deceased.  Mother died at age 47 of hypertension and complications of a stroke.  Father died about age 26.  Two brothers, one with history of Parkinson's, one with history of myocardial infarction.   GYNECOLOGIC HISTORY: She is gravida 1, para 1. Postmenopausal for over 50 years.  Menarche at age 44.  She was on Prempro for 2-3 years and stopped.    SOCIAL HISTORY: The patient lost her husband Cassandra Webb in 03/2010.  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  The patient has an adult son who runs [employer].  The patient enjoys watching television, particularly [employer] in her spare time.   ADVANCED DIRECTIVES: The patient has a health care power of attorney document in her chart signed on 11/06/2002 which named her husband Cassandra Webb as her health care power of attorney (now deceased).  Her son Cassandra Webb was named as her health care power of attorney in the event her husband was not able to serve as her health care power of attorney.    HEALTH MAINTENANCE: History   Substance Use Topics  . Smoking status: Never Smoker   . Smokeless tobacco: Never Used  . Alcohol Use: No    Colonoscopy: Not on file PAP: Not on file Bone density: Her last bone density examination on 05/25/2011 showed a T score of -1.2 (osteopenia). Lipid panel: Not on file  Allergies  Allergen Reactions  . Milk-Related Compounds     Causes sinus symptoms when she has milk products     Current Outpatient Prescriptions  Medication Sig Dispense Refill  . amLODipine (NORVASC) 10 MG tablet Take 10 mg by mouth daily.       . Butalbital-Acetaminophen 50-300 MG TABS Take 1 tablet by mouth every 4 (four) hours as needed.      . Ergocalciferol (VITAMIN D2 PO) Take 1.25 mg by mouth once a week.      . hydrochlorothiazide (HYDRODIURIL) 25 MG tablet Take 25 mg by mouth daily.       Marland Kitchen HYDROcodone-acetaminophen (NORCO) 10-325 MG per tablet Take 1 tablet by mouth every 6 (six) hours as needed.       Marland Kitchen letrozole (FEMARA) 2.5 MG tablet Take 1 tablet (2.5 mg total) by mouth daily.  30 tablet  2  . metoprolol succinate (TOPROL-XL) 100 MG 24 hr tablet Take 50 mg by mouth daily. Take with or immediately following a meal.      . ramipril (ALTACE) 10 MG capsule Take 30 mg by mouth daily. 20 mg in the am and 10 mg in the pm      . simvastatin (ZOCOR) 20 MG tablet Take 20 mg by mouth at bedtime.       . traMADol (ULTRAM) 50 MG tablet Take 50-100 mg by mouth every 8 (eight) hours as needed.        No current facility-administered medications for this visit.    OBJECTIVE: Filed Vitals:   06/26/12 1314  BP: 144/83  Pulse: 68  Temp: 98.4 F (36.9 C)  Resp: 20     Body mass index is 27.6 kg/(m^2).      ECOG FS:  Grade 3 - Capable of only limited self-care             General appearance: Alert, cooperative, well nourished, no apparent distress, the patient is in a wheelchair Head: Normocephalic, without obvious abnormality, atraumatic Eyes: Arcus senilis, PERRLA, EOMI Nose: Nares, septum and  mucosa are normal, no drainage or sinus tenderness Neck: No adenopathy, supple, symmetrical, trachea midline, thyroid not enlarged, no tenderness Resp: Clear to auscultation bilaterally Cardio: Regular rate and rhythm, S1, S2 normal, no murmur, click, rub or gallop Breasts: Right breast is visually smaller than the left breast, the right breast has a well-healed surgical scar, lumpectomy scar site has thickened scar tissue, left breast is pendulous and has a firm glandular area by her nipple,  no lymphadenopathy, no nipple inversion, no axilla fullness GI: Soft, distended, non-tender,  normoactive bowel sounds, no organomegaly Extremities: Extremities normal, atraumatic, no cyanosis or edema, limited range of motion in bilateral upper and lower extremities Lymph nodes: Cervical, supraclavicular, and axillary nodes normal Neurologic: Grossly normal   LAB RESULTS: Lab Results  Component Value Date   WBC 4.4 04/16/2010   NEUTROABS 2.7 04/16/2010   HGB 12.2 04/16/2010   HCT 36.1 04/16/2010   MCV 101.5* 04/16/2010   PLT 224 04/16/2010      Chemistry      Component Value Date/Time   NA 135 04/16/2010 1549   K 3.7 04/16/2010 1549   CL 99 04/16/2010 1549   CO2 29 04/16/2010 1549   BUN 15 04/16/2010 1549   CREATININE 0.69 04/16/2010 1549      Component Value Date/Time   CALCIUM 9.2 04/16/2010 1549   ALKPHOS 64 04/16/2010 1549   AST 19 04/16/2010 1549   ALT 17 04/16/2010 1549   BILITOT 0.4 04/16/2010 1549       Lab Results  Component Value Date   LABCA2 15 04/16/2010    Urinalysis No results found for this basename: colorurine,  appearanceur,  labspec,  phurine,  glucoseu,  hgbur,  bilirubinur,  ketonesur,  proteinur,  urobilinogen,  nitrite,  leukocytesur    STUDIES: No results found.  ASSESSMENT: 79 y[place], [place]: 1.  Status post mammogram and ultrasound on 12/27/2006 at [facility] which showed a new circumscribed right breast mass with lobulated  margins that lies medially and measured 15 mm x 10 mm x 14 mm in size.    2.  Status post right breast needle core biopsy on 01/17/2007 which showed invasive mammary carcinoma with invasive ductal carcinoma/high-grade atypia features, ER 39%, PR 0%, Ki-67 44%, HER-2/neu positive at 3+.  3.  Status post 2D echocardiogram on 02/02/2007 which showed an ejection fraction of 65%.  4.  Status post neoadjuvant chemotherapy with TCH from 03/03/2007 through 06/16/2007 x 6 cycles.  Therapy with Herceptin continued until 03/27/2008.  5.  Status post right breast needle/wire localized lumpectomy with sentinel node biopsy and excisional biopsy on 07/11/2007 which showed no residual carcinoma (a complete pathologic response), ypT0 (R0) ypN0 (i-) (sn), ER 39%, PR 0%, Ki-67 44%, HER-2/neu by FISH positive at 3+.  6.  Status post radiation therapy with right breast MammoSite from 07/17/2007 through 07/21/2007.  7.  The patient has been on antiestrogen therapy with Femara since 07/2007 until the present time.   PLAN: The patient is nearing 6  years from her time of diagnosis and will officially become a graduate of [facility]'s breast cancer program today.  We asked that she continue annual clinical breast examinations by a physician in addition to annual mammography.  Her last mammogram on 05/25/2011 showed postlumpectomy scarring changes are present on the right breast do not appear significantly changed from previous exam and no specific abnormality is noted in either breast. She is due for her annual mammogram now, but declined for [place] to schedule a mammogram for her.  Her last bone density scan showed a T score of -1.2 (osteopenia).  She is due for another bone density examination in 04/2013.  All questions were answered.  The patient was encouraged to contact us with any problems, questions or concerns.    Larina Bras, NP-C 06/26/2012, 7:00 PM

## 2012-06-26 NOTE — Patient Instructions (Addendum)
Please contact us at [phone] if you have any questions or concerns.  Go to [employer] - [email or web address] to see your picture.  Have Dr. Debroah Loop complete a breast exam every year.  Please consider having a mammogram every year also.   Health Maintenance, Females  A healthy lifestyle and preventative care can promote health and wellness.  Maintain regular health, dental, and eye exams.  Eat a healthy diet. Foods like vegetables, fruits, whole grains, low-fat dairy products, and lean protein foods contain the nutrients you need without too many calories. Decrease your intake of foods high in solid fats, added sugars, and salt. Get information about a proper diet from your caregiver, if necessary.  Regular physical exercise is one of the most important things you can do for your health. Most adults should get at least 150 minutes of moderate-intensity exercise (any activity that increases your heart rate and causes you to sweat) each week. In addition, most adults need muscle-strengthening exercises on 2 or more days a week.  Maintain a healthy weight. The body mass index (BMI) is a screening tool to identify possible weight problems. It provides an estimate of body fat based on height and weight. Your caregiver can help determine your BMI, and can help you achieve or maintain a healthy weight. For adults 20 years and older:  A BMI below 18.5 is considered underweight.  A BMI of 18.5 to 24.9 is normal.  A BMI of 25 to 29.9 is considered overweight.  A BMI of 30 and above is considered obese.  Maintain normal blood lipids and cholesterol by exercising and minimizing your intake of saturated fat. Eat a balanced diet with plenty of fruits and vegetables. Blood tests for lipids and cholesterol should begin at age 19 and be repeated every 5 years. If your lipid or cholesterol levels are high, you are over 50, or you are a high risk for heart disease, you may need your  cholesterol levels checked more frequently. Ongoing high lipid and cholesterol levels should be treated with medicines if diet and exercise are not effective.  If you smoke, find out from your caregiver how to quit. If you do not use tobacco, do not start.  If you are pregnant, do not drink alcohol. If you are breastfeeding, be very cautious about drinking alcohol. If you are not pregnant and choose to drink alcohol, do not exceed 1 drink per day. One drink is considered to be 12 ounces (355 mL) of beer, 5 ounces (148 mL) of wine, or 1.5 ounces (44 mL) of liquor.  Avoid use of street drugs. Do not share needles with anyone. Ask for help if you need support or instructions about stopping the use of drugs.  High blood pressure causes heart disease and increases the risk of stroke. Blood pressure should be checked at least every 1 to 2 years. Ongoing high blood pressure should be treated with medicines, if weight loss and exercise are not effective.  If you are 56 to 77 years old, ask your caregiver if you should take aspirin to prevent strokes.  Diabetes screening involves taking a blood sample to check your fasting blood sugar level. This should be done once every 3 years, after age 4, if you are within normal weight and without risk factors for diabetes. Testing should be considered at a younger age or be carried out more frequently if you are overweight and have at least 1 risk factor for diabetes.  Breast cancer  screening is essential preventative care for women. You should practice "breast self-awareness." This means understanding the normal appearance and feel of your breasts and may include breast self-examination. Any changes detected, no matter how small, should be reported to a caregiver. Women in their 7s and 30s should have a clinical breast exam (CBE) by a caregiver as part of a regular health exam every 1 to 3 years. After age 71, women should have a CBE every year. Starting at age 49, women  should consider having a mammogram (breast X-ray) every year. Women who have a family history of breast cancer should talk to their caregiver about genetic screening. Women at a high risk of breast cancer should talk to their caregiver about having an MRI and a mammogram every year.  The Pap test is a screening test for cervical cancer. Women should have a Pap test starting at age 70. Between ages 90 and 26, Pap tests should be repeated every 2 years. Beginning at age 79, you should have a Pap test every 3 years as long as the past 3 Pap tests have been normal. If you had a hysterectomy for a problem that was not cancer or a condition that could lead to cancer, then you no longer need Pap tests. If you are between ages 89 and 56, and you have had normal Pap tests going back 10 years, you no longer need Pap tests. If you have had past treatment for cervical cancer or a condition that could lead to cancer, you need Pap tests and screening for cancer for at least 20 years after your treatment. If Pap tests have been discontinued, risk factors (such as a new sexual partner) need to be reassessed to determine if screening should be resumed. Some women have medical problems that increase the chance of getting cervical cancer. In these cases, your caregiver may recommend more frequent screening and Pap tests.  The human papillomavirus (HPV) test is an additional test that may be used for cervical cancer screening. The HPV test looks for the virus that can cause the cell changes on the cervix. The cells collected during the Pap test can be tested for HPV. The HPV test could be used to screen women aged 54 years and older, and should be used in women of any age who have unclear Pap test results. After the age of 20, women should have HPV testing at the same frequency as a Pap test.  Colorectal cancer can be detected and often prevented. Most routine colorectal cancer screening begins at the age of 38 and continues through  age 46. However, your caregiver may recommend screening at an earlier age if you have risk factors for colon cancer. On a yearly basis, your caregiver may provide home test kits to check for hidden blood in the stool. Use of a small camera at the end of a tube, to directly examine the colon (sigmoidoscopy or colonoscopy), can detect the earliest forms of colorectal cancer. Talk to your caregiver about this at age 76, when routine screening begins. Direct examination of the colon should be repeated every 5 to 10 years through age 39, unless early forms of pre-cancerous polyps or small growths are found.  Hepatitis C blood testing is recommended for all people born from [birth year] through 1963 and any individual with known risks for hepatitis C.  Practice safe sex. Use condoms and avoid high-risk sexual practices to reduce the spread of sexually transmitted infections (STIs). Sexually active women aged 64 and  younger should be checked for Chlamydia, which is a common sexually transmitted infection. Older women with new or multiple partners should also be tested for Chlamydia. Testing for other STIs is recommended if you are sexually active and at increased risk.  Osteoporosis is a disease in which the bones lose minerals and strength with aging. This can result in serious bone fractures. The risk of osteoporosis can be identified using a bone density scan. Women ages 94 and over and women at risk for fractures or osteoporosis should discuss screening with their caregivers. Ask your caregiver whether you should be taking a calcium supplement or vitamin D to reduce the rate of osteoporosis.  Menopause can be associated with physical symptoms and risks. Hormone replacement therapy is available to decrease symptoms and risks. You should talk to your caregiver about whether hormone replacement therapy is right for you.  Use sunscreen with a sun protection factor (SPF) of 30 or greater. Apply sunscreen liberally and  repeatedly throughout the day. You should seek shade when your shadow is shorter than you. Protect yourself by wearing long sleeves, pants, a wide-brimmed hat, and sunglasses year round, whenever you are outdoors.  Notify your caregiver of new moles or changes in moles, especially if there is a change in shape or color. Also notify your caregiver if a mole is larger than the size of a pencil eraser.  Stay current with your immunizations.  Document Released: 08/31/2010 Document Revised: 05/10/2011 Document Reviewed: 08/31/2010 [facility] Patient Information 2013 [place], [place].

## 2012-11-16 DIAGNOSIS — M159 Polyosteoarthritis, unspecified: Secondary | ICD-10-CM | POA: Diagnosis not present

## 2012-11-16 DIAGNOSIS — M549 Dorsalgia, unspecified: Secondary | ICD-10-CM | POA: Diagnosis not present

## 2012-11-16 DIAGNOSIS — R609 Edema, unspecified: Secondary | ICD-10-CM | POA: Diagnosis not present

## 2012-11-16 DIAGNOSIS — I10 Essential (primary) hypertension: Secondary | ICD-10-CM | POA: Diagnosis not present

## 2012-11-16 DIAGNOSIS — M2550 Pain in unspecified joint: Secondary | ICD-10-CM | POA: Diagnosis not present

## 2013-01-08 ENCOUNTER — Non-Acute Institutional Stay: Payer: Medicare Other | Admitting: Internal Medicine

## 2013-01-08 ENCOUNTER — Encounter: Payer: Self-pay | Admitting: Internal Medicine

## 2013-01-08 VITALS — BP 144/76 | HR 68 | Temp 97.5°F | Ht 62.25 in | Wt 141.0 lb

## 2013-01-08 DIAGNOSIS — IMO0001 Reserved for inherently not codable concepts without codable children: Secondary | ICD-10-CM

## 2013-01-08 DIAGNOSIS — L602 Onychogryphosis: Secondary | ICD-10-CM | POA: Insufficient documentation

## 2013-01-08 DIAGNOSIS — M5126 Other intervertebral disc displacement, lumbar region: Secondary | ICD-10-CM

## 2013-01-08 DIAGNOSIS — E785 Hyperlipidemia, unspecified: Secondary | ICD-10-CM | POA: Diagnosis not present

## 2013-01-08 DIAGNOSIS — M797 Fibromyalgia: Secondary | ICD-10-CM

## 2013-01-08 DIAGNOSIS — M549 Dorsalgia, unspecified: Secondary | ICD-10-CM | POA: Diagnosis not present

## 2013-01-08 DIAGNOSIS — I10 Essential (primary) hypertension: Secondary | ICD-10-CM

## 2013-01-08 DIAGNOSIS — Z8669 Personal history of other diseases of the nervous system and sense organs: Secondary | ICD-10-CM

## 2013-01-08 DIAGNOSIS — G5600 Carpal tunnel syndrome, unspecified upper limb: Secondary | ICD-10-CM

## 2013-01-08 DIAGNOSIS — L608 Other nail disorders: Secondary | ICD-10-CM

## 2013-01-08 DIAGNOSIS — G5601 Carpal tunnel syndrome, right upper limb: Secondary | ICD-10-CM | POA: Insufficient documentation

## 2013-01-08 MED ORDER — HYDROCODONE-ACETAMINOPHEN 10-325 MG PO TABS: ORAL_TABLET | ORAL | Status: DC

## 2013-01-08 NOTE — Progress Notes (Signed)
Patient ID: Cassandra Webb, female   DOB: 18-Apr-1933, 79 y.o.   MRN: [id number] Nursing Home Location:  [facility]   Place of Service: Clinic (12)  PCP: Kimber Relic, MD  Code Status: Living Will. DNR  Contact Information   Name Relation Home Work Wilson Son [phone]  [phone]        Allergies  Allergen Reactions  . Milk-Related Compounds     Causes sinus symptoms when she has milk products     Chief Complaint  Patient presents with  . Medical Managment of Chronic Issues    New Patient  switching doctors.  Blood pressure, cholesterol    HPI:  Hypertension: controlled  Carpal tunnel syndrome of right wrist: some tenderness and tingling in the right median nerve distribution  Back pain: controlled at present  Hyperlipidemia: controlled  History of migraine headaches: rare   Lumbar herniated disc: contributes to chronic back pains  Fibromyalgia: diffuse and migratory musculotendinous pains  Onychogryphosis: chronic      Past Medical History  Diagnosis Date  . Breast cancer 12/2007  . Tonsillitis   . Carpal tunnel syndrome of right wrist   . Cataracts, bilateral   . Hypertension   . Back pain   . Hyperlipidemia   . History of migraine headaches   . Lumbar herniated disc   . Fibromyalgia     Past Surgical History  Procedure Laterality Date  . Tonsillectomy    . Ankle surgery Right   . Cesarean section  1970  . Carpal tunnel release Right   . Lumbar disc surgery  07-20-04    Dr. Darrelyn Hillock  . Cataract extraction Bilateral 2006-07-21    Dr. Dione Booze  . Breast lumpectomy with axillary lymph node biopsy  12/08, 12/09    Dr. Jamey Ripa     CONSULTANTS Ophth: Dione Booze Gen Surg: Arbie CookeyDarrelyn Hillock   Social History: History   Social History  . Marital Status: Widowed    Spouse Name: N/A    Number of Children: N/A  . Years of Education: N/A   Occupational History  . 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 Smoking status: Never Smoker   . Smokeless tobacco: Never Used  . Alcohol Use: No  . Drug Use: No  . Sexual Activity: No   Other Topics Concern  . None   Social History Narrative   Patient lives at [facility] since 2010/07/21   Widowed. 07-21-2010. Husband died from mesothelioma.   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   Living Will -DNR    Family History Family Status  Relation Status Death Age  . Mother Deceased 34  . Brother Alive   . Brother Alive   . Father Deceased 68  . Son Alive    Family History  Problem Relation Age of Onset  . Heart Problems Mother   . Seizures Brother   . Parkinson's disease Brother   . Heart Problems Brother   . Asthma Son      Medications: Patient's Medications  New Prescriptions   No medications on file  Previous Medications   AMLODIPINE (NORVASC) 10 MG TABLET    Take 10 mg by mouth daily.    BUTALBITAL-ACETAMINOPHEN 50-300 MG TABS    Take 1 tablet by mouth every 4 (four) hours as needed.   CYCLOBENZAPRINE (FLEXERIL) 10 MG TABLET    Take 10 mg by mouth. Take 1/ to 1  three times daily   ERGOCALCIFEROL (VITAMIN D2 PO)    Take 1.25 mg by mouth once a week.   HYDROCHLOROTHIAZIDE (HYDRODIURIL) 25 MG TABLET    Take 25 mg by mouth daily.    HYDROCODONE-ACETAMINOPHEN (NORCO) 10-325 MG PER TABLET    Take 1 tablet by mouth every 6 (six) hours as needed.    METOPROLOL SUCCINATE (TOPROL-XL) 100 MG 24 HR TABLET    Take 50 mg by mouth. Take 1/2 to 1 once daily for blood pressure   NAPROXEN (NAPROSYN) 500 MG TABLET    Take 500 mg by mouth 2 (two) times daily with a meal. As needed   POLYETHYLENE GLYCOL (MIRALAX / GLYCOLAX) PACKET    Take 17 g by mouth daily.   POTASSIUM CHLORIDE ER 20 MEQ TBCR    Take by mouth. Take one tablet twice daily   RAMIPRIL (ALTACE) 10 MG CAPSULE    Take 30 mg by mouth daily. 20 mg in the am and 10 mg in the pm   SIMVASTATIN (ZOCOR) 20 MG TABLET    Take 20 mg by mouth at bedtime.    Modified Medications   No medications on file  Discontinued Medications   LETROZOLE (FEMARA) 2.5 MG TABLET    Take 1 tablet (2.5 mg total) by mouth daily.   TRAMADOL (ULTRAM) 50 MG TABLET    Take 50-100 mg by mouth every 8 (eight) hours as needed.     Immunization History  Administered Date(s) Administered  . Pneumococcal Polysaccharide 03/02/2007     Review of Systems  Constitutional:       Gaining weight. History of breast cancer.  HENT: Positive for hearing loss.        Sinus congestion Dry mouth  Eyes: Negative.   Cardiovascular: Negative for chest pain, palpitations, leg swelling and PND.  Gastrointestinal: Positive for constipation.       Hemorrhoids  Genitourinary: Positive for frequency (related to use of diuretic.).  Musculoskeletal: Positive for back pain and joint pain (knee).       Osteoporosis  Neurological: Negative.        Headaches. Memory loss.  Endo/Heme/Allergies: Bruises/bleeds easily.  Psychiatric/Behavioral: Negative.      Filed Vitals:   01/08/13 1520  BP: 144/76  Pulse: 68  Temp: 97.5 F (36.4 C)  TempSrc: Oral  Height: 5' 2.25" (1.581 m)  Weight: 141 lb (63.957 kg)   Physical Exam  Constitutional: She is oriented to person, place, and time. She appears well-developed and well-nourished. No distress.  HENT:  Head: Normocephalic.  Right Ear: External ear normal.  Left Ear: External ear normal.  Nose: Nose normal.  Mouth/Throat: Oropharynx is clear and moist.  Eyes: Conjunctivae and EOM are normal. Pupils are equal, round, and reactive to light.  Corrective lenses  Neck: Normal range of motion. Neck supple. No JVD present. No tracheal deviation present. No thyromegaly present.  Cardiovascular: Normal rate, regular rhythm, normal heart sounds and intact distal pulses.  Exam reveals no gallop and no friction rub.   No murmur heard. Pulmonary/Chest: Effort normal and breath sounds normal. No respiratory distress. She has no wheezes. She  has no rales.  Old scar of the right breast from cancer surgery.  Abdominal: She exhibits no distension and no mass. There is no tenderness. No hernia.  Musculoskeletal: Normal range of motion. She exhibits no edema and no tenderness.  Neurological: She is alert and oriented to person, place, and time. She has normal reflexes. She displays normal reflexes. No  cranial nerve deficit. Coordination normal.  Skin: Skin is warm and dry. No rash noted. No erythema. No pallor.  Psychiatric: She has a normal mood and affect. Her behavior is normal. Judgment and thought content normal.      Labs reviewed: Abstract on 01/05/2013  Component Date Value Ref Range Status  . HM Dexa Scan 05/25/2011 T score of -1.2 osteopenia   Final    Assessment/Plan 1. Hypertension controlled  2. Carpal tunnel syndrome of right wrist Prior repair.  3. Back pain Chronic. Maiintaiins control with Vicodin  4. Hyperlipidemia Needs recheck  5. History of migraine headaches controlled  6. Lumbar herniated disc Chronic pain  7. Fibromyalgia Diagnosed by her previous PCP, Dr. Debroah Loop. Was tried on Cymbalta, but she stopped when she could not tell a difference.   Labs/tests ordered: CMP, Lipid, ESR

## 2013-01-08 NOTE — Patient Instructions (Signed)
Continue current medications. 

## 2013-02-06 ENCOUNTER — Other Ambulatory Visit: Payer: Self-pay | Admitting: Geriatric Medicine

## 2013-02-06 MED ORDER — HYDROCODONE-ACETAMINOPHEN 10-325 MG PO TABS: ORAL_TABLET | ORAL | Status: DC

## 2013-02-06 NOTE — Progress Notes (Signed)
Phone  call from patient requesting reorder of hydrocodone Rx by Dr.Green 03/10/2012. Medication is working "OK". New RX written. Pt. Has f/u appt with Dr.Green 04/2013

## 2013-02-09 ENCOUNTER — Other Ambulatory Visit: Payer: Self-pay | Admitting: *Deleted

## 2013-02-09 MED ORDER — AMLODIPINE BESYLATE 10 MG PO TABS: 10.0000 mg | ORAL_TABLET | Freq: Every day | ORAL | Status: DC

## 2013-02-09 MED ORDER — METOPROLOL SUCCINATE ER 100 MG PO TB24: ORAL_TABLET | ORAL | Status: DC

## 2013-03-22 ENCOUNTER — Telehealth: Payer: Self-pay | Admitting: Geriatric Medicine

## 2013-03-22 MED ORDER — HYDROCODONE-ACETAMINOPHEN 10-325 MG PO TABS: ORAL_TABLET | ORAL | Status: DC

## 2013-03-22 NOTE — Telephone Encounter (Signed)
Phone call from patient today requesting refill of hydrocodone. Medication is helping her manage her pain. Have written a new prescription today, patient has next appointment with Dr. Nyoka Cowden in February.

## 2013-04-10 DIAGNOSIS — M549 Dorsalgia, unspecified: Secondary | ICD-10-CM | POA: Diagnosis not present

## 2013-04-10 DIAGNOSIS — IMO0001 Reserved for inherently not codable concepts without codable children: Secondary | ICD-10-CM | POA: Diagnosis not present

## 2013-04-10 DIAGNOSIS — I10 Essential (primary) hypertension: Secondary | ICD-10-CM | POA: Diagnosis not present

## 2013-04-10 DIAGNOSIS — E785 Hyperlipidemia, unspecified: Secondary | ICD-10-CM | POA: Diagnosis not present

## 2013-04-10 LAB — HEPATIC FUNCTION PANEL
ALK PHOS: 72 U/L (ref 25–125)
ALT: 14 U/L (ref 7–35)
AST: 13 U/L (ref 13–35)
BILIRUBIN, TOTAL: 0.2 mg/dL

## 2013-04-10 LAB — TSH: TSH: 5.67 u[IU]/mL (ref 0.41–5.90)

## 2013-04-10 LAB — LIPID PANEL
Cholesterol: 184 mg/dL (ref 0–200)
HDL: 30 mg/dL — AB (ref 35–70)
LDL CALC: 90 mg/dL
Triglycerides: 327 mg/dL — AB (ref 40–160)

## 2013-04-10 LAB — BASIC METABOLIC PANEL
BUN: 19 mg/dL (ref 4–21)
CREATININE: 0.7 mg/dL (ref 0.5–1.1)
GLUCOSE: 109 mg/dL
Potassium: 3.8 mmol/L (ref 3.4–5.3)
SODIUM: 136 mmol/L — AB (ref 137–147)

## 2013-04-10 LAB — POCT ERYTHROCYTE SEDIMENTATION RATE, NON-AUTOMATED: Sed Rate: 12 mm

## 2013-04-16 ENCOUNTER — Encounter: Payer: Self-pay | Admitting: Internal Medicine

## 2013-04-16 ENCOUNTER — Non-Acute Institutional Stay: Payer: Medicare Other | Admitting: Internal Medicine

## 2013-04-16 VITALS — BP 142/78 | HR 64 | Ht 62.25 in | Wt 142.0 lb

## 2013-04-16 DIAGNOSIS — M549 Dorsalgia, unspecified: Secondary | ICD-10-CM

## 2013-04-16 DIAGNOSIS — E785 Hyperlipidemia, unspecified: Secondary | ICD-10-CM

## 2013-04-16 DIAGNOSIS — I10 Essential (primary) hypertension: Secondary | ICD-10-CM | POA: Diagnosis not present

## 2013-04-16 DIAGNOSIS — M797 Fibromyalgia: Secondary | ICD-10-CM

## 2013-04-16 DIAGNOSIS — E039 Hypothyroidism, unspecified: Secondary | ICD-10-CM

## 2013-04-16 DIAGNOSIS — IMO0001 Reserved for inherently not codable concepts without codable children: Secondary | ICD-10-CM

## 2013-04-16 MED ORDER — HYDROCODONE-ACETAMINOPHEN 10-325 MG PO TABS: ORAL_TABLET | ORAL | Status: DC

## 2013-04-16 MED ORDER — LEVOTHYROXINE SODIUM 25 MCG PO TABS: ORAL_TABLET | ORAL | Status: DC

## 2013-04-16 NOTE — Progress Notes (Signed)
Patient ID: Cassandra Webb, female   DOB: January 19, 1934, 80 y.o.   MRN: [id number]    Location:  [facility] (12)    Allergies  Allergen Reactions  . Milk-Related Compounds     Causes sinus symptoms when she has milk products     Chief Complaint  Patient presents with  . Medical Managment of Chronic Issues    blood pressure, cholesterol    HPI:  Hypertension: controlled  Hyperlipidemia: LDL OK, Trig a little high, HDL low  Back pain - benefits from  HYDROcodone-acetaminophen (NORCO) 10-325 MG per tablet  Fibromyalgia: generalized  Hypothyroidism - high TSH. New diagnosis. No prior higtory of hypothyroidism    Medications: Patient's Medications  New Prescriptions   No medications on file  Previous Medications   AMLODIPINE (NORVASC) 10 MG TABLET    Take 1 tablet (10 mg total) by mouth daily.   BUTALBITAL-ACETAMINOPHEN 50-300 MG TABS    Take 1 tablet by mouth every 4 (four) hours as needed.   CYCLOBENZAPRINE (FLEXERIL) 10 MG TABLET    Take 10 mg by mouth. Take 1/2  to 1 three times daily   ERGOCALCIFEROL (VITAMIN D2 PO)    Take 1.25 mg by mouth once a week.   HYDROCHLOROTHIAZIDE (HYDRODIURIL) 25 MG TABLET    Take 25 mg by mouth daily.    HYDROCODONE-ACETAMINOPHEN (NORCO) 10-325 MG PER TABLET    One every 4 hours if needed for pain   METOPROLOL SUCCINATE (TOPROL-XL) 100 MG 24 HR TABLET    Take 1/2 to 1 once daily for blood pressure   POLYETHYLENE GLYCOL (MIRALAX / GLYCOLAX) PACKET    Take 17 g by mouth daily.   POTASSIUM CHLORIDE ER 20 MEQ TBCR    Take by mouth. Take one tablet twice daily   RAMIPRIL (ALTACE) 10 MG CAPSULE    Take 30 mg by mouth daily. 20 mg in the am and 10 mg in the pm  Modified Medications   No medications on file  Discontinued Medications   NAPROXEN (NAPROSYN) 500 MG TABLET    Take 500 mg by mouth 2 (two) times daily with a meal. As needed   SIMVASTATIN (ZOCOR) 20 MG TABLET    Take 20 mg by mouth at  bedtime.      Review of Systems  Constitutional: Positive for activity change and fatigue. Negative for fever and unexpected weight change.  HENT: Positive for hearing loss and rhinorrhea.   Eyes: Positive for visual disturbance (corrective lenses).  Cardiovascular: Negative for chest pain, palpitations and leg swelling.  Endocrine:       Hx hyperglycemia   Genitourinary: Positive for frequency (related to use of diuretic).  Musculoskeletal: Positive for arthralgias, back pain, gait problem and myalgias.  Skin: Negative.   Allergic/Immunologic: Negative.   Hematological: Bruises/bleeds easily.  Psychiatric/Behavioral: Positive for dysphoric mood.    Filed Vitals:   04/16/13 1337  BP: 142/78  Pulse: 64  Height: 5' 2.25" (1.581 m)  Weight: 142 lb (64.411 kg)   Physical Exam  Constitutional: She is oriented to person, place, and time. She appears well-developed and well-nourished. No distress.  HENT:  Head: Normocephalic and atraumatic.  Loss of hearing  Eyes: Conjunctivae and EOM are normal. Pupils are equal, round, and reactive to light.  Neck: No JVD present. No tracheal deviation present. No thyromegaly present.  Cardiovascular: Normal rate and intact distal pulses.  Exam reveals no gallop and no friction rub.   No  murmur heard. Pulmonary/Chest: No respiratory distress. She has no wheezes. She has no rales. She exhibits no tenderness.  Old scar right breast from cancer surgery  Abdominal: She exhibits no distension and no mass. There is no tenderness.  Musculoskeletal: Normal range of motion. She exhibits no edema and no tenderness.  Back pain. Able to move in Apt with walker. Riding wheelchair to clinic.  Lymphadenopathy:    She has no cervical adenopathy.  Neurological: She is alert and oriented to person, place, and time. She has normal reflexes. No cranial nerve deficit. Coordination normal.  Skin: No rash noted. No erythema. No pallor.  Psychiatric: Thought content  normal.  Unhappy     Labs reviewed: Nursing Home on 04/16/2013  Component Date Value Ref Range Status  . Sed Rate 04/10/2013 12   Final  . Glucose 04/10/2013 109   Final  . BUN 04/10/2013 19  4 - 21 mg/dL Final  . Creatinine 04/10/2013 0.7  0.5 - 1.1 mg/dL Final  . Potassium 04/10/2013 3.8  3.4 - 5.3 mmol/L Final  . Sodium 04/10/2013 136* 137 - 147 mmol/L Final  . Triglycerides 04/10/2013 327* 40 - 160 mg/dL Final  . Cholesterol 04/10/2013 184  0 - 200 mg/dL Final  . HDL 04/10/2013 30* 35 - 70 mg/dL Final  . LDL Cholesterol 04/10/2013 90   Final  . Alkaline Phosphatase 04/10/2013 72  25 - 125 U/L Final  . ALT 04/10/2013 14  7 - 35 U/L Final  . AST 04/10/2013 13  13 - 35 U/L Final  . Bilirubin, Total 04/10/2013 0.2   Final  . TSH 04/10/2013 5.67  0.41 - 5.90 uIU/mL Final      Assessment/Plan  1. Hypertension controlled  2. Hyperlipidemia controlled  3. Back pain chronic - HYDROcodone-acetaminophen (NORCO) 10-325 MG per tablet; One every 4 hours if needed for pain  Dispense: 180 tablet; Refill: 0  4. Fibromyalgia Unchanged.  5. Hypothyroidism New diagnosis - levothyroxine (LEVOTHROID) 25 MCG tablet; One daily for thyroid supplementation  Dispense: 90 tablet; Refill: 3

## 2013-05-30 ENCOUNTER — Other Ambulatory Visit: Payer: Self-pay | Admitting: Geriatric Medicine

## 2013-05-30 DIAGNOSIS — M549 Dorsalgia, unspecified: Secondary | ICD-10-CM

## 2013-05-30 MED ORDER — HYDROCODONE-ACETAMINOPHEN 10-325 MG PO TABS: ORAL_TABLET | ORAL | Status: DC

## 2013-07-03 ENCOUNTER — Other Ambulatory Visit: Payer: Self-pay | Admitting: Geriatric Medicine

## 2013-07-03 DIAGNOSIS — M549 Dorsalgia, unspecified: Secondary | ICD-10-CM

## 2013-07-10 DIAGNOSIS — I10 Essential (primary) hypertension: Secondary | ICD-10-CM | POA: Diagnosis not present

## 2013-07-10 DIAGNOSIS — E039 Hypothyroidism, unspecified: Secondary | ICD-10-CM | POA: Diagnosis not present

## 2013-07-10 LAB — TSH: TSH: 4.34 u[IU]/mL (ref 0.41–5.90)

## 2013-07-10 LAB — BASIC METABOLIC PANEL
BUN: 17 mg/dL (ref 4–21)
Creatinine: 0.7 mg/dL (ref 0.5–1.1)
Glucose: 104 mg/dL
Potassium: 3.5 mmol/L (ref 3.4–5.3)
Sodium: 139 mmol/L (ref 137–147)

## 2013-07-10 LAB — HEPATIC FUNCTION PANEL
ALK PHOS: 74 U/L (ref 25–125)
ALT: 12 U/L (ref 7–35)
AST: 12 U/L — AB (ref 13–35)
BILIRUBIN, TOTAL: 0.3 mg/dL

## 2013-07-16 ENCOUNTER — Encounter: Payer: Self-pay | Admitting: Internal Medicine

## 2013-07-16 ENCOUNTER — Non-Acute Institutional Stay: Payer: Medicare Other | Admitting: Internal Medicine

## 2013-07-16 VITALS — BP 156/80 | HR 61 | Temp 98.1°F | Ht 62.25 in | Wt 134.6 lb

## 2013-07-16 DIAGNOSIS — I10 Essential (primary) hypertension: Secondary | ICD-10-CM | POA: Diagnosis not present

## 2013-07-16 DIAGNOSIS — E039 Hypothyroidism, unspecified: Secondary | ICD-10-CM | POA: Diagnosis not present

## 2013-07-16 DIAGNOSIS — E785 Hyperlipidemia, unspecified: Secondary | ICD-10-CM

## 2013-07-16 DIAGNOSIS — M5126 Other intervertebral disc displacement, lumbar region: Secondary | ICD-10-CM | POA: Diagnosis not present

## 2013-07-16 DIAGNOSIS — L608 Other nail disorders: Secondary | ICD-10-CM

## 2013-07-16 DIAGNOSIS — M542 Cervicalgia: Secondary | ICD-10-CM

## 2013-07-16 DIAGNOSIS — L602 Onychogryphosis: Secondary | ICD-10-CM

## 2013-07-16 MED ORDER — NAPROXEN SODIUM 220 MG PO TABS: ORAL_TABLET | ORAL | Status: DC

## 2013-07-16 NOTE — Progress Notes (Signed)
Patient ID: Cassandra Webb, female   DOB: 01-20-1934, 80 y.o.   MRN: [id number]    Location:  [facility]  Place of Service: CLINIC    Allergies  Allergen Reactions  . Milk-Related Compounds     Causes sinus symptoms when she has milk products     Chief Complaint  Patient presents with  . Medical Management of Chronic Issues    To evaulate Thyroid,Pain and BP    HPI:  Pain in the back of her neck when she tries to straiten it up. Has a stiff feeling. In the neck. She has not tried anything to relieve the pain.  Sebaceous cyst on the right cheek. No pain.  Continues with back pain and benefits from Vicodin.  Hypothyroidism: She quit taking levothroxine. She thought it caused her to urinate too frequently.  Hypertension:controlled  Hyperlipidemia: recheck next time  Lumbar herniated disc: chronic back oain. Riding in wheelchair  Onychogryphosis: toenails are difficult for her to reach. Wants referral to podiatry.    Medications: Patient's Medications  New Prescriptions   No medications on file  Previous Medications   AMLODIPINE (NORVASC) 10 MG TABLET    Take 1 tablet (10 mg total) by mouth daily.   BUTALBITAL-ACETAMINOPHEN 50-300 MG TABS    Take 1 tablet by mouth every 4 (four) hours as needed.   CYCLOBENZAPRINE (FLEXERIL) 10 MG TABLET    Take 10 mg by mouth. Take 1/2  to 1 three times daily   ERGOCALCIFEROL (VITAMIN D2 PO)    Take 1.25 mg by mouth once a week.   HYDROCHLOROTHIAZIDE (HYDRODIURIL) 25 MG TABLET    Take 25 mg by mouth daily.    HYDROCODONE-ACETAMINOPHEN (NORCO) 10-325 MG PER TABLET    One every 4 hours if needed for pain   LEVOTHYROXINE (LEVOTHROID) 25 MCG TABLET    One daily for thyroid supplementation   METOPROLOL SUCCINATE (TOPROL-XL) 100 MG 24 HR TABLET    Take 1/2 to 1 once daily for blood pressure   POLYETHYLENE GLYCOL (MIRALAX / GLYCOLAX) PACKET    Take 17 g by mouth daily.   RAMIPRIL (ALTACE) 10 MG CAPSULE    Take 30 mg by mouth daily. 20 mg in the  am and 10 mg in the pm  Modified Medications   No medications on file  Discontinued Medications   POTASSIUM CHLORIDE ER 20 MEQ TBCR    Take by mouth. Take one tablet twice daily     Review of Systems  Constitutional: Positive for activity change and fatigue. Negative for fever and unexpected weight change.  HENT: Positive for hearing loss and rhinorrhea.   Eyes: Positive for visual disturbance (corrective lenses).  Cardiovascular: Negative for chest pain, palpitations and leg swelling.  Endocrine:       Hx hyperglycemia   Genitourinary: Positive for frequency (related to use of diuretic).  Musculoskeletal: Positive for arthralgias, back pain, gait problem and myalgias.  Skin: Negative.   Allergic/Immunologic: Negative.   Hematological: Bruises/bleeds easily.  Psychiatric/Behavioral: Positive for dysphoric mood.    Filed Vitals:   07/16/13 1359  BP: 156/80  Pulse: 61  Temp: 98.1 F (36.7 C)  TempSrc: Oral  Height: 5' 2.25" (1.581 m)  Weight: 134 lb 9.6 oz (61.054 kg)   Body mass index is 24.43 kg/(m^2).  Physical Exam  Constitutional: She is oriented to person, place, and time. She appears well-developed and well-nourished. No distress.  HENT:  Head: Normocephalic and atraumatic.  Loss of hearing  Eyes: Conjunctivae and EOM  are normal. Pupils are equal, round, and reactive to light.  Neck: No JVD present. No tracheal deviation present. No thyromegaly present.  Cardiovascular: Normal rate and intact distal pulses.  Exam reveals no gallop and no friction rub.   No murmur heard. Pulmonary/Chest: No respiratory distress. She has no wheezes. She has no rales. She exhibits no tenderness.  Old scar right breast from cancer surgery  Abdominal: She exhibits no distension and no mass. There is no tenderness.  Musculoskeletal: Normal range of motion. She exhibits no edema and no tenderness.  Back pain. Able to move in Apt with walker. Riding wheelchair to clinic.    Lymphadenopathy:    She has no cervical adenopathy.  Neurological: She is alert and oriented to person, place, and time. She has normal reflexes. No cranial nerve deficit. Coordination normal.  Skin: No rash noted. No erythema. No pallor.  Sebaceous cyst right cheek. Onychogryphosis.  Psychiatric: Thought content normal.  Unhappy     Labs reviewed: Nursing Home on 07/16/2013  Component Date Value Ref Range Status  . Glucose 07/10/2013 104   Final  . BUN 07/10/2013 17  4 - 21 mg/dL Final  . Creatinine 07/10/2013 0.7  0.5 - 1.1 mg/dL Final  . Potassium 07/10/2013 3.5  3.4 - 5.3 mmol/L Final  . Sodium 07/10/2013 139  137 - 147 mmol/L Final  . Alkaline Phosphatase 07/10/2013 74  25 - 125 U/L Final  . ALT 07/10/2013 12  7 - 35 U/L Final  . AST 07/10/2013 12* 13 - 35 U/L Final  . Bilirubin, Total 07/10/2013 0.3   Final  . TSH 07/10/2013 4.34  0.41 - 5.90 uIU/mL Final      Assessment/Plan  1. Hypothyroidism Continue to monitor off levothyroxine-TSH  2. Hypertension Controlled -BMP  3. Hyperlipidemia - lipids  4. Lumbar herniated disc unchanged  5. Onychogryphosis -Refer to podiatrist  6. Neck pain -Aleve 220 mg bid - heating pad prn

## 2013-07-18 DIAGNOSIS — H1045 Other chronic allergic conjunctivitis: Secondary | ICD-10-CM | POA: Diagnosis not present

## 2013-07-18 DIAGNOSIS — H18519 Endothelial corneal dystrophy, unspecified eye: Secondary | ICD-10-CM | POA: Diagnosis not present

## 2013-07-18 DIAGNOSIS — Z961 Presence of intraocular lens: Secondary | ICD-10-CM | POA: Diagnosis not present

## 2013-07-18 DIAGNOSIS — H109 Unspecified conjunctivitis: Secondary | ICD-10-CM | POA: Diagnosis not present

## 2013-08-07 ENCOUNTER — Other Ambulatory Visit: Payer: Self-pay

## 2013-08-07 DIAGNOSIS — B351 Tinea unguium: Secondary | ICD-10-CM | POA: Diagnosis not present

## 2013-08-07 DIAGNOSIS — M549 Dorsalgia, unspecified: Secondary | ICD-10-CM

## 2013-08-07 DIAGNOSIS — M205X9 Other deformities of toe(s) (acquired), unspecified foot: Secondary | ICD-10-CM | POA: Diagnosis not present

## 2013-08-07 MED ORDER — HYDROCODONE-ACETAMINOPHEN 10-325 MG PO TABS: ORAL_TABLET | ORAL | Status: DC

## 2013-09-10 ENCOUNTER — Other Ambulatory Visit: Payer: Self-pay | Admitting: *Deleted

## 2013-09-10 MED ORDER — HYDROCODONE-ACETAMINOPHEN 10-325 MG PO TABS: ORAL_TABLET | ORAL | Status: DC

## 2013-09-10 NOTE — Telephone Encounter (Signed)
Patient Requested and given to Debbie to take to [facility] for patient to pick up

## 2013-10-03 ENCOUNTER — Other Ambulatory Visit: Payer: Self-pay

## 2013-10-03 MED ORDER — BUTALBITAL-ACETAMINOPHEN 50-300 MG PO TABS: ORAL_TABLET | ORAL | Status: DC

## 2013-10-15 ENCOUNTER — Telehealth: Payer: Self-pay

## 2013-10-15 MED ORDER — HYDROCODONE-ACETAMINOPHEN 10-325 MG PO TABS: ORAL_TABLET | ORAL | Status: DC

## 2013-10-15 NOTE — Telephone Encounter (Signed)
Message left on triage voicemail: Patient needs rx for hydrocodone and rx for headache medication. Patient states the new headache medication that Dr.Greeen rx'ed was 400 dollars and she needs to switch back to Butalb-Acetaminophen 50-300 1 by mouth every 4 hours as needed #180. Patient states she took this medication for a long time and has no concerns of liver damage.   Dr.Green please advise if ok to print rx and if yes dispense number

## 2013-10-15 NOTE — Telephone Encounter (Signed)
Spoke with patient, patient states she does not know the name of medication, she could not afford to pick it up. Yes rx for Hydrocodone was printed and given to Avel Sensor this am to bring to [facility] today for signature and then to patient.  Patient would like to know if you would rx her Bultab/Acetaminophen 50-300 for headaches # 180? If so please handwrite rx and have Debbie give to patient, patient will need to take hard copy to local pharmacy.

## 2013-10-15 NOTE — Telephone Encounter (Signed)
[facility] patient did get #30 Butalbital on 10/03/13 cost $20.00. They told the patient that she could get #180 tablets for $120.00. Called patient left message on voice mail that her Rx for Hydrocodone can be picked up at the front desk at [facility]. I would call her back about the Butalbital.

## 2013-10-15 NOTE — Telephone Encounter (Signed)
Her last visit was 07/06/13. I don't think I changed anything.  What is the name of the medication that costs $400?  It looks like she got a prescription for Norco today. Is that right?  Her next appt is 11/19/13.

## 2013-10-16 ENCOUNTER — Other Ambulatory Visit: Payer: Self-pay | Admitting: Internal Medicine

## 2013-10-16 DIAGNOSIS — R51 Headache: Secondary | ICD-10-CM

## 2013-10-16 MED ORDER — BUTALBITAL-ACETAMINOPHEN 50-300 MG PO TABS: ORAL_TABLET | ORAL | Status: DC

## 2013-10-16 NOTE — Telephone Encounter (Signed)
Called patient Dr. Nyoka Cowden wrote Rx for the [place] #180, will have it at the front desk at [facility] tomorrow morning. Dr. Nyoka Cowden doesn't want her to take the Hydrocodone and the Butalbital with Acetaminophen. She needs to decide which one she wants to take with the Acetaminophen. She takes the Hydrocodone for her back and other pains and the Butalbital for headaches. She has appointment 11/19/13 with Dr. Nyoka Cowden, she can talk with him then.

## 2013-10-18 ENCOUNTER — Telehealth: Payer: Self-pay

## 2013-10-18 NOTE — Telephone Encounter (Signed)
Received message from [facility] regarding Cassandra Webb's Butalbital-Acetaminophen 50-300 written on 10/16/13 cost $400.00. She has been on Fioricet 50-325-40  since 2012. They gave her the Fioricet #180 one tablet up to 4 times a day as needed for pain or headache. Cost $120.00. They want Dr. Nyoka Cowden to know this and make sure he is ok with this. Spoke with Dr. Nyoka Cowden, ok, called [facility] back, made changes in medication list.

## 2013-10-18 NOTE — Telephone Encounter (Signed)
I approved the butalbital and APAP #180 tabs yesterday

## 2013-11-01 ENCOUNTER — Other Ambulatory Visit: Payer: Self-pay | Admitting: Internal Medicine

## 2013-11-16 ENCOUNTER — Telehealth: Payer: Self-pay

## 2013-11-16 NOTE — Telephone Encounter (Signed)
[facility] clinic nurse told me that Mrs. Lymon did not come for lab work, and told her she was going to cancel her appt with Dr. Nyoka Cowden for Monday 11/19/13. Asked if I could call her. Called Mrs. Terance Hart about her appt., she wanted me to cancel it but needs a Rx t for pain medication. I told her that Dr. Nyoka Cowden wanted to see her to talk to her about her pain medication and she needed to keep this appt. Patient kept going on about going today (Friday) at 7:00 to have her blood work drawn and no one knew anything about it. I told her lab is done Tuesday  and Thursday. She was to get it done on Thursday. She said someone called her to come in today. I told her Mliss Sax called her Wednesday to come in Thursday. She was upset and doesn't like the run around she's getting. I again told the patient that she needs to come in Monday to see Dr. Nyoka Cowden about her pain medication before he writes another Rx. She didn't like this but she would come in.

## 2013-11-19 ENCOUNTER — Encounter: Payer: Self-pay | Admitting: Internal Medicine

## 2013-11-19 ENCOUNTER — Non-Acute Institutional Stay: Payer: Medicare Other | Admitting: Internal Medicine

## 2013-11-19 VITALS — BP 142/76 | HR 66 | Wt 137.0 lb

## 2013-11-19 DIAGNOSIS — I10 Essential (primary) hypertension: Secondary | ICD-10-CM

## 2013-11-19 DIAGNOSIS — E785 Hyperlipidemia, unspecified: Secondary | ICD-10-CM

## 2013-11-19 DIAGNOSIS — M797 Fibromyalgia: Secondary | ICD-10-CM

## 2013-11-19 DIAGNOSIS — M545 Low back pain, unspecified: Secondary | ICD-10-CM | POA: Diagnosis not present

## 2013-11-19 DIAGNOSIS — R51 Headache: Secondary | ICD-10-CM

## 2013-11-19 DIAGNOSIS — IMO0001 Reserved for inherently not codable concepts without codable children: Secondary | ICD-10-CM

## 2013-11-19 DIAGNOSIS — E039 Hypothyroidism, unspecified: Secondary | ICD-10-CM | POA: Diagnosis not present

## 2013-11-19 MED ORDER — CYCLOBENZAPRINE HCL 10 MG PO TABS: ORAL_TABLET | ORAL | Status: DC

## 2013-11-19 MED ORDER — BUTALBITAL-APAP-CAFFEINE 50-325-40 MG PO TABS: ORAL_TABLET | ORAL | Status: DC

## 2013-11-19 MED ORDER — HYDROCODONE-ACETAMINOPHEN 10-325 MG PO TABS: ORAL_TABLET | ORAL | Status: DC

## 2013-11-19 NOTE — Progress Notes (Signed)
Patient ID: Cassandra Webb, female   DOB: [birth year], 80 y.o.   MRN: [id number]    Location:  [facility] (12)    Allergies  Allergen Reactions  . Milk-Related Compounds     Causes sinus symptoms when she has milk products     Chief Complaint  Patient presents with  . Medical Management of Chronic Issues    blood pressure, chronic pain. Needs refill on pain medication    HPI:  Patient is upset about being encouraged to come to today's appt.. I explained to her that I thought it was necessary to see her periodically because of the medications she takes, because of the unresolved issue of possible hypothyroidism.  She goes on to describe her quiet lifestyle and "private" personality. She has been reviewing her stamp collection. She watches TV.  Fibromyalgia: Persistent. Pain all over.  Midline low back pain without sciatica: chronic and unchanged. Using Vicodin  Essential hypertension: controlled  Hypothyroidism: she did not get TSH as recommended.  Hyperlipidemia: Did not get lab.    Medications: Patient's Medications  New Prescriptions   No medications on file  Previous Medications   AMLODIPINE (NORVASC) 10 MG TABLET    Take 1 tablet (10 mg total) by mouth daily.   BUTALBITAL-ACETAMINOPHEN-CAFFEINE (FIORICET) 50-325-40 MG PER TABLET    Take by mouth. Take one tablet up to 4 times a day as needed pain or headache   CYCLOBENZAPRINE (FLEXERIL) 10 MG TABLET    Take 10 mg by mouth. Take 1/2  to 1 three times daily   ERGOCALCIFEROL (VITAMIN D2 PO)    Take 1.25 mg by mouth once a week.   HYDROCHLOROTHIAZIDE (HYDRODIURIL) 25 MG TABLET    TAKE 1 TABLET ONCE DAILY.   HYDROCODONE-ACETAMINOPHEN (NORCO) 10-325 MG PER TABLET    One every 4 hours if needed for pain   METOPROLOL SUCCINATE (TOPROL-XL) 100 MG 24 HR TABLET    Take 1/2 to 1 once daily for blood pressure   POLYETHYLENE GLYCOL (MIRALAX / GLYCOLAX) PACKET    Take 17 g by  mouth daily.   RAMIPRIL (ALTACE) 10 MG CAPSULE    TAKE (1) CAPSULE THREE TIMES DAILY.  Modified Medications   No medications on file  Discontinued Medications   LEVOTHYROXINE (LEVOTHROID) 25 MCG TABLET    One daily for thyroid supplementation   NAPROXEN SODIUM (ALEVE) 220 MG TABLET    One twice daily to help neck pains     Review of Systems  Constitutional: Positive for activity change and fatigue. Negative for fever and unexpected weight change.  HENT: Positive for hearing loss and rhinorrhea.   Eyes: Positive for visual disturbance (corrective lenses).  Cardiovascular: Negative for chest pain, palpitations and leg swelling.  Endocrine:       Hx hyperglycemia   Genitourinary: Positive for frequency (related to use of diuretic).  Musculoskeletal: Positive for arthralgias, back pain, gait problem and myalgias.  Skin: Negative.   Allergic/Immunologic: Negative.   Hematological: Bruises/bleeds easily.  Psychiatric/Behavioral: Positive for dysphoric mood.    Filed Vitals:   11/19/13 1409  BP: 142/76  Pulse: 66  Weight: 137 lb (62.143 kg)  SpO2: 97%   Body mass index is 24.86 kg/(m^2).  Physical Exam  Constitutional: She is oriented to person, place, and time. She appears well-developed and well-nourished. No distress.  HENT:  Head: Normocephalic and atraumatic.  Loss of hearing  Eyes: Conjunctivae and EOM are normal. Pupils are equal, round, and  reactive to light.  Neck: No JVD present. No tracheal deviation present. No thyromegaly present.  Cardiovascular: Normal rate and intact distal pulses.  Exam reveals no gallop and no friction rub.   No murmur heard. Pulmonary/Chest: No respiratory distress. She has no wheezes. She has no rales. She exhibits no tenderness.  Old scar right breast from cancer surgery  Abdominal: She exhibits no distension and no mass. There is no tenderness.  Musculoskeletal: Normal range of motion. She exhibits no edema and no tenderness.  Back pain.  Able to move in Apt with walker. Riding wheelchair to clinic.  Lymphadenopathy:    She has no cervical adenopathy.  Neurological: She is alert and oriented to person, place, and time. She has normal reflexes. No cranial nerve deficit. Coordination normal.  Skin: No rash noted. No erythema. No pallor.  Sebaceous cyst right cheek. Onychogryphosis.  Psychiatric: Thought content normal.  Unhappy     Labs reviewed: No visits with results within 3 Month(s) from this visit. Latest known visit with results is:  [facility]ursing Home on 07/16/2013  Component Date Value Ref Range Status  . Glucose 07/10/2013 104   Final  . BUN 07/10/2013 17  4 - 21 mg/dL Final  . Creatinine 07/10/2013 0.7  0.5 - 1.1 mg/dL Final  . Potassium 07/10/2013 3.5  3.4 - 5.3 mmol/L Final  . Sodium 07/10/2013 139  137 - 147 mmol/L Final  . Alkaline Phosphatase 07/10/2013 74  25 - 125 U/L Final  . ALT 07/10/2013 12  7 - 35 U/L Final  . AST 07/10/2013 12* 13 - 35 U/L Final  . Bilirubin, Total 07/10/2013 0.3   Final  . TSH 07/10/2013 4.34  0.41 - 5.90 uIU/mL Final    Assessment/Plan  1. Fibromyalgia unchanged  2. Midline low back pain without sciatica unchanged  3. Essential hypertension -CMP  4. Hypothyroidism -TSH, FUTURE  5. Hyperlipidemia -LIPIDS, future

## 2013-12-24 ENCOUNTER — Other Ambulatory Visit: Payer: Self-pay | Admitting: *Deleted

## 2013-12-24 DIAGNOSIS — M545 Low back pain, unspecified: Secondary | ICD-10-CM

## 2013-12-24 MED ORDER — HYDROCODONE-ACETAMINOPHEN 10-325 MG PO TABS: ORAL_TABLET | ORAL | Status: DC

## 2013-12-24 NOTE — Telephone Encounter (Signed)
Patient Requested and will send someone to pick up due to [name] not going out to [facility] today because Dr. Nyoka Cowden on Vacation.

## 2014-01-17 ENCOUNTER — Other Ambulatory Visit: Payer: Self-pay | Admitting: Internal Medicine

## 2014-01-29 ENCOUNTER — Other Ambulatory Visit: Payer: Self-pay | Admitting: Internal Medicine

## 2014-01-29 DIAGNOSIS — M545 Low back pain, unspecified: Secondary | ICD-10-CM

## 2014-01-29 MED ORDER — HYDROCODONE-ACETAMINOPHEN 10-325 MG PO TABS: ORAL_TABLET | ORAL | Status: DC

## 2014-01-29 NOTE — Telephone Encounter (Signed)
[facility]  

## 2014-01-30 ENCOUNTER — Other Ambulatory Visit: Payer: Self-pay | Admitting: *Deleted

## 2014-01-30 DIAGNOSIS — M545 Low back pain, unspecified: Secondary | ICD-10-CM

## 2014-01-30 MED ORDER — HYDROCODONE-ACETAMINOPHEN 10-325 MG PO TABS: ORAL_TABLET | ORAL | Status: DC

## 2014-01-30 NOTE — Telephone Encounter (Signed)
Pharmacy requested for patient. Patient to pick up

## 2014-03-04 ENCOUNTER — Other Ambulatory Visit: Payer: Self-pay

## 2014-03-04 ENCOUNTER — Telehealth: Payer: Self-pay | Admitting: Internal Medicine

## 2014-03-04 DIAGNOSIS — M545 Low back pain, unspecified: Secondary | ICD-10-CM

## 2014-03-04 MED ORDER — HYDROCODONE-ACETAMINOPHEN 10-325 MG PO TABS: ORAL_TABLET | ORAL | Status: DC

## 2014-03-04 NOTE — Telephone Encounter (Signed)
Pt called request Rx for Hydrocodine 10-325mg .  Take on tablet every 4 hrs as needed for pain.   Debbie to take Rx to [facility] on tomorrow...[facility]

## 2014-03-04 NOTE — Telephone Encounter (Signed)
Cassandra  Webb and Cassandra Webb will take to [facility]

## 2014-03-12 ENCOUNTER — Non-Acute Institutional Stay: Payer: Medicare Other | Admitting: Internal Medicine

## 2014-03-12 ENCOUNTER — Encounter: Payer: Self-pay | Admitting: Internal Medicine

## 2014-03-12 VITALS — BP 142/74 | HR 63 | Temp 97.7°F | Wt 147.0 lb

## 2014-03-12 DIAGNOSIS — L089 Local infection of the skin and subcutaneous tissue, unspecified: Secondary | ICD-10-CM

## 2014-03-12 DIAGNOSIS — L723 Sebaceous cyst: Secondary | ICD-10-CM | POA: Diagnosis not present

## 2014-03-12 MED ORDER — CEPHALEXIN 250 MG PO CAPS: 250.0000 mg | ORAL_CAPSULE | Freq: Four times a day (QID) | ORAL | Status: DC

## 2014-03-12 NOTE — Progress Notes (Signed)
Patient ID: Cassandra Webb, female   DOB: 1933-12-31, 81 y.o.   MRN: [id number]   Location:  [facility]  Code Status: not on record  Allergies  Allergen Reactions  . Milk-Related Compounds     Causes sinus symptoms when she has milk products     Chief Complaint  Patient presents with  . Cyst    on back of neck. Been there for awhile, recently gotten bigger.    HPI: Patient is a 81 y.o. white female seen in the office today for an acute visit.  Cyst on the back of her neck has grown.  It was dormant for a long time.  3-4 wks ago, she got her hair done.  Thinks maybe comb hit it or something and it grew.   Has been told by a previous physician that it was a sebaceous cyst.  Was given keflex that reduced its size for years and now it grew.  Does not really hurt--a little sore.  There was a little drainage and it formed a head on it.    Has a h/o breast cancer.  Takes hydrocodone for her back--she had surgery there years ago.  She thinks it was a failed surgery.  Has had PT.  She sits with her feet elevated in her recliner.  Left knee is bad.  Uses wheelchair to get around, but needs someone to hold her up to ambulate along with her cane.  Pain in her back.  Even gets pain in her ankles at times if she keeps her feet down.  Ok if elevated.  Review of Systems:  Review of Systems  Constitutional: Negative for fever and chills.  Musculoskeletal: Positive for back pain and joint pain.  Skin:       Inflamed cyst on posterior neck  Psychiatric/Behavioral: Positive for depression.     Past Medical History  Diagnosis Date  . Breast cancer 12/2007  . Tonsillitis   . Carpal tunnel syndrome of right wrist   . Cataracts, bilateral   . Hypertension   . Back pain 2005  . Hyperlipidemia   . History of migraine headaches   . Lumbar herniated disc   . Fibromyalgia   . Pain in right knee 2008    Past Surgical History  Procedure Laterality Date  . Tonsillectomy    . Ankle  surgery Right   . Cesarean section  1970  . Carpal tunnel release Right   . Lumbar disc surgery  2006    Dr. Gladstone Lighter  . Cataract extraction Bilateral 2008    Dr. Katy Fitch  . Breast lumpectomy with axillary lymph node biopsy  12/08, 12/09    Dr. Margot Chimes     Social History:   reports that she has never smoked. She has never used smokeless tobacco. She reports that she does not drink alcohol or use illicit drugs.  Family History  Problem Relation Age of Onset  . Heart Problems Mother   . Seizures Brother   . Parkinson's disease Brother   . Heart Problems Brother   . Asthma Son     Medications: Patient's Medications  New Prescriptions   No medications on file  Previous Medications   AMLODIPINE (NORVASC) 10 MG TABLET    Take one tablet by mouth once daily to control blood pressure   BUTALBITAL-ACETAMINOPHEN-CAFFEINE (FIORICET, ESGIC) 50-325-40 MG PER TABLET    TAKE 1 TABLET UP TO 4 TIMES A DAY AS NEEDED FOR PAIN OR HEADACHE.   CYCLOBENZAPRINE (FLEXERIL) 10 MG  TABLET    Take 1/2  to 1 three times daily as needed to relax muscles   ERGOCALCIFEROL (VITAMIN D2 PO)    Take 1.25 mg by mouth once a week.   HYDROCHLOROTHIAZIDE (HYDRODIURIL) 25 MG TABLET    TAKE 1 TABLET ONCE DAILY.   HYDROCODONE-ACETAMINOPHEN (NORCO) 10-325 MG PER TABLET    Take one tablet by mouth every 4 hours as needed for pain   METOPROLOL SUCCINATE (TOPROL-XL) 100 MG 24 HR TABLET    Take 1/2 to 1 once daily for blood pressure   POLYETHYLENE GLYCOL (MIRALAX / GLYCOLAX) PACKET    Take 17 g by mouth daily.   RAMIPRIL (ALTACE) 10 MG CAPSULE    TAKE (1) CAPSULE THREE TIMES DAILY.   VITAMIN D, ERGOCALCIFEROL, (DRISDOL) 50000 UNITS CAPS CAPSULE    TAKE 1 CAPSULE ONCE A WEEK.  Modified Medications   No medications on file  Discontinued Medications   No medications on file     Physical Exam: Filed Vitals:   03/12/14 1619  BP: 142/74  Pulse: 63  Temp: 97.7 F (36.5 C)  TempSrc: Oral  Weight: 147 lb (66.679 kg)  SpO2:  99%  Physical Exam  Constitutional: She appears well-developed and well-nourished. No distress.  Neck: Neck supple.  Approximately 1 inch round raised, mildly tender cyst with opening--with pressure, quite a bit of thick yellow-green material came out  Cardiovascular: Normal rate, regular rhythm and normal heart sounds.   Pulmonary/Chest: Effort normal and breath sounds normal.  Skin:  See neck exam     Labs reviewed: Basic Metabolic Panel:  Recent Labs  04/10/13 07/10/13  NA 136* 139  K 3.8 3.5  BUN 19 17  CREATININE 0.7 0.7  TSH 5.67 4.34   Liver Function Tests:  Recent Labs  04/10/13 07/10/13  AST 13 12*  ALT 14 12  ALKPHOS 72 74  Lipid Panel:  Recent Labs  04/10/13  CHOL 184  HDL 30*  LDLCALC 90  TRIG 327*   Assessment/Plan 1. Infected sebaceous cyst of skin -warm compresses at least 4x daily to the inflamed cyst -keflex 250mg  po qid for 7 days -if it continues to be tender, red, warm, draining or she develops a fever, she is to call back so we can debride the area  Next appt:  Keep regular visit in March for physical (has not been wanting to come for regular appts, but wants to receive pain medications)  Kelley Knoth L. Chanler Mendonca, D.O. [facility] [address], [place] [address] Cell Phone (Mon-Fri 8am-5pm):  [phone] On Call:  [phone] & follow prompts after 5pm & weekends Office Phone:  [phone] Office Fax:  [phone]

## 2014-03-15 NOTE — Addendum Note (Signed)
Addended by: Gayland Curry on: 03/15/2014 08:48 AM   Modules accepted: Level of Service

## 2014-04-09 ENCOUNTER — Other Ambulatory Visit: Payer: Self-pay | Admitting: *Deleted

## 2014-04-09 DIAGNOSIS — M545 Low back pain, unspecified: Secondary | ICD-10-CM

## 2014-04-09 MED ORDER — HYDROCODONE-ACETAMINOPHEN 10-325 MG PO TABS: ORAL_TABLET | ORAL | Status: DC

## 2014-04-09 NOTE — Telephone Encounter (Signed)
Debbie to take to [facility] to give to patient. Henderson Newcomer. Notified.

## 2014-04-22 ENCOUNTER — Other Ambulatory Visit: Payer: Self-pay | Admitting: Nurse Practitioner

## 2014-04-30 ENCOUNTER — Other Ambulatory Visit: Payer: Self-pay | Admitting: Internal Medicine

## 2014-05-09 ENCOUNTER — Encounter: Payer: Self-pay | Admitting: Nurse Practitioner

## 2014-05-13 ENCOUNTER — Other Ambulatory Visit: Payer: Self-pay | Admitting: *Deleted

## 2014-05-13 DIAGNOSIS — M545 Low back pain, unspecified: Secondary | ICD-10-CM

## 2014-05-13 MED ORDER — BUTALBITAL-APAP-CAFFEINE 50-325-40 MG PO TABS: ORAL_TABLET | ORAL | Status: DC

## 2014-05-13 MED ORDER — HYDROCODONE-ACETAMINOPHEN 10-325 MG PO TABS: ORAL_TABLET | ORAL | Status: DC

## 2014-05-13 NOTE — Telephone Encounter (Signed)
Patient Requested 

## 2014-05-13 NOTE — Telephone Encounter (Signed)
[facility]  

## 2014-05-16 DIAGNOSIS — E785 Hyperlipidemia, unspecified: Secondary | ICD-10-CM | POA: Diagnosis not present

## 2014-05-16 DIAGNOSIS — E039 Hypothyroidism, unspecified: Secondary | ICD-10-CM | POA: Diagnosis not present

## 2014-05-16 DIAGNOSIS — I10 Essential (primary) hypertension: Secondary | ICD-10-CM | POA: Diagnosis not present

## 2014-05-16 LAB — HEPATIC FUNCTION PANEL
ALK PHOS: 66 U/L (ref 25–125)
ALT: 15 U/L (ref 7–35)
AST: 12 U/L — AB (ref 13–35)
Bilirubin, Total: 0.3 mg/dL

## 2014-05-16 LAB — LIPID PANEL
CHOLESTEROL: 201 mg/dL — AB (ref 0–200)
HDL: 32 mg/dL — AB (ref 35–70)
LDL Cholesterol: 102 mg/dL
LDl/HDL Ratio: 3.2
TRIGLYCERIDES: 322 mg/dL — AB (ref 40–160)

## 2014-05-16 LAB — BASIC METABOLIC PANEL
BUN: 17 mg/dL (ref 4–21)
CREATININE: 0.7 mg/dL (ref 0.5–1.1)
Glucose: 114 mg/dL
POTASSIUM: 4.5 mmol/L (ref 3.4–5.3)
Sodium: 135 mmol/L — AB (ref 137–147)

## 2014-05-16 LAB — TSH: TSH: 2.45 u[IU]/mL (ref 0.41–5.90)

## 2014-05-20 ENCOUNTER — Encounter: Payer: Self-pay | Admitting: Internal Medicine

## 2014-05-27 ENCOUNTER — Encounter: Payer: Self-pay | Admitting: Internal Medicine

## 2014-05-28 ENCOUNTER — Encounter: Payer: Self-pay | Admitting: Internal Medicine

## 2014-05-28 ENCOUNTER — Non-Acute Institutional Stay: Payer: Medicare Other | Admitting: Internal Medicine

## 2014-05-28 VITALS — BP 132/76 | HR 76 | Temp 97.6°F | Wt 151.0 lb

## 2014-05-28 DIAGNOSIS — M545 Low back pain, unspecified: Secondary | ICD-10-CM

## 2014-05-28 DIAGNOSIS — Z853 Personal history of malignant neoplasm of breast: Secondary | ICD-10-CM

## 2014-05-28 DIAGNOSIS — L570 Actinic keratosis: Secondary | ICD-10-CM | POA: Diagnosis not present

## 2014-05-28 DIAGNOSIS — R635 Abnormal weight gain: Secondary | ICD-10-CM | POA: Diagnosis not present

## 2014-05-28 DIAGNOSIS — I10 Essential (primary) hypertension: Secondary | ICD-10-CM

## 2014-05-28 DIAGNOSIS — B369 Superficial mycosis, unspecified: Secondary | ICD-10-CM | POA: Diagnosis not present

## 2014-05-28 DIAGNOSIS — M797 Fibromyalgia: Secondary | ICD-10-CM | POA: Diagnosis not present

## 2014-05-28 MED ORDER — CLOTRIMAZOLE-BETAMETHASONE 1-0.05 % EX CREA: 1.0000 "application " | TOPICAL_CREAM | Freq: Two times a day (BID) | CUTANEOUS | Status: DC

## 2014-05-28 NOTE — Progress Notes (Signed)
Patient ID: Cassandra Webb, female   DOB: 1933/09/18, 81 y.o.   MRN: [id number]    Location:  [facility]  Advanced Directive information Does patient have an advance directive?: Yes, Type of Advance Directive: Living will;Healthcare Power of Attorney, Does patient want to make changes to advanced directive?: No - Patient declined   Allergies  Allergen Reactions  . Milk-Related Compounds     Causes sinus symptoms when she has milk products     Chief Complaint  Patient presents with  . Medical Management of Chronic Issues    Fibromyalgia, heachaches, blood pressure, thyroid, cholesterol    HPI: Patient is a 81 y.o. white female seen in the office today for med mgt of her chronic diseases.  Has gained a little weight.  Knows exercise.  Right knee bad, left not great.  Back is also bad.  Says food is fattening.  Wants to know how to lose.  Does not want to meet with the dietitian.  Discussed portion sizes, more greens, less starches and fatty foods.  Has a place on her ear that bothers her and is afraid to pick it.  Says she had the same thing many years ago and it was removed, but has now grown back.  Non painful.  Has a rash in her left armpit.  Does not itch badly, but annoying.  Tried antibiotic ointment to some degree--greasy and messes up clothes.    Needs monthly hydrocodone/apap.  Due April 14th.  Wanted me to give script with advanced date--again explained that this is not legal and I could not do it.  Last mammo 2013 and bone density.  Does want mammos at [facility].  Took femara in past.    Review of Systems:  Review of Systems  Constitutional: Positive for malaise/fatigue. Negative for fever and chills.       Weight gain  HENT: Negative for congestion and hearing loss.   Eyes: Negative for blurred vision.  Respiratory: Negative for shortness of breath.   Cardiovascular: Negative for chest pain.  Gastrointestinal: Negative for abdominal pain, constipation, blood in  stool and melena.  Genitourinary: Negative for dysuria, urgency and frequency.  Musculoskeletal: Positive for myalgias and back pain. Negative for falls.  Neurological: Positive for weakness. Negative for dizziness and headaches.  Psychiatric/Behavioral: Positive for depression. Negative for memory loss.     Past Medical History  Diagnosis Date  . Breast cancer 12/2007  . Tonsillitis   . Carpal tunnel syndrome of right wrist   . Cataracts, bilateral   . Hypertension   . Back pain 2005  . Hyperlipidemia   . History of migraine headaches   . Lumbar herniated disc   . Fibromyalgia   . Pain in right knee 2008    Past Surgical History  Procedure Laterality Date  . Tonsillectomy    . Ankle surgery Right   . Cesarean section  1970  . Carpal tunnel release Right   . Lumbar disc surgery  2006    Dr. Gladstone Lighter  . Cataract extraction Bilateral 2008    Dr. Katy Fitch  . Breast lumpectomy with axillary lymph node biopsy  12/08, 12/09    Dr. Margot Chimes     Social History:   reports that she has never smoked. She has never used smokeless tobacco. She reports that she does not drink alcohol or use illicit drugs.  Family History  Problem Relation Age of Onset  . Heart Problems Mother   . Seizures Brother   .  Parkinson's disease Brother   . Heart Problems Brother   . Asthma Son     Medications: Patient's Medications  New Prescriptions   No medications on file  Previous Medications   AMLODIPINE (NORVASC) 10 MG TABLET    Take one tablet by mouth once daily to control blood pressure   BUTALBITAL-ACETAMINOPHEN-CAFFEINE (FIORICET, ESGIC) 50-325-40 MG PER TABLET    Take one tablet by mouth up to four times daily as needed for pain or headache   CYCLOBENZAPRINE (FLEXERIL) 10 MG TABLET    Take 1/2  to 1 three times daily as needed to relax muscles   HYDROCHLOROTHIAZIDE (HYDRODIURIL) 25 MG TABLET    TAKE 1 TABLET ONCE DAILY.   HYDROCODONE-ACETAMINOPHEN (NORCO) 10-325 MG PER TABLET    Take one  tablet by mouth every 4 hours as needed for pain   METOPROLOL SUCCINATE (TOPROL-XL) 100 MG 24 HR TABLET    TAKE 1/2 TO 1 TABLET ONCE DAILY FOR HIGH BLOOD PRESSURE.   POLYETHYLENE GLYCOL (MIRALAX / GLYCOLAX) PACKET    Take 17 g by mouth daily.   RAMIPRIL (ALTACE) 10 MG CAPSULE    TAKE (1) CAPSULE THREE TIMES DAILY.   VITAMIN D, ERGOCALCIFEROL, (DRISDOL) 50000 UNITS CAPS CAPSULE    TAKE 1 CAPSULE ONCE A WEEK.  Modified Medications   No medications on file  Discontinued Medications   CEPHALEXIN (KEFLEX) 250 MG CAPSULE    Take 1 capsule (250 mg total) by mouth 4 (four) times daily.   ERGOCALCIFEROL (VITAMIN D2 PO)    Take 1.25 mg by mouth once a week.     Physical Exam: Filed Vitals:   05/28/14 1616  BP: 132/76  Pulse: 76  Temp: 97.6 F (36.4 C)  TempSrc: Oral  Weight: 151 lb (68.493 kg)  SpO2: 97%  Physical Exam  Constitutional: She is oriented to person, place, and time. She appears well-developed and well-nourished. No distress.  Overweight white female seated in wheelchair, but is able to stand using arms  HENT:  Head: Normocephalic and atraumatic.  Eyes:  glasses  Cardiovascular: Normal rate, regular rhythm, normal heart sounds and intact distal pulses.   Pulmonary/Chest: Effort normal and breath sounds normal.  Abdominal: Soft. Bowel sounds are normal.  Musculoskeletal: She exhibits tenderness.  Stooped posture on standing; fibromyalgia tender points present  Neurological: She is alert and oriented to person, place, and time.  Skin: Skin is warm and dry. There is pallor.  Right ear with stuck-on papule, but surrounding ear is cauliflower-like with erythema; left axilla with small erythematous macular patch that is raised at borders  Psychiatric:  Flat affect     Labs reviewed: Basic Metabolic Panel:  Recent Labs  07/10/13  NA 139  K 3.5  BUN 17  CREATININE 0.7  TSH 4.34   Liver Function Tests:  Recent Labs  07/10/13  AST 12*  ALT 12  ALKPHOS 74    Assessment/Plan 1. Fungal rash of torso -Fungal cream left axilla ordered: - clotrimazole-betamethasone (LOTRISONE) cream; Apply 1 application topically 2 (two) times daily. To left axilla until rash resolves  Dispense: 45 g; Refill: 0  2. Weight gain -discussed exercise, dietary changes, portion sizes -swimming would help her fibromyalgia, as well -she does not express motivation to change  3. Actinic keratosis - right ear; this is suspicious-looking primarily due to the erythema and cauliflower appearance of the surrounding ear--she was not thrilled to have to go see another doctor (she is very unenthused about the whole process), but she agreed  with encouragement - Ambulatory referral to Dermatology  4. Fibromyalgia -continues on flexeril, norco -again, encouraged exercise   5. Midline low back pain without sciatica -cont norco for this with some benefit  6. Essential hypertension -bp is at goal of less than 140/90  7.  History of breast cancer: Agrees to get f/u mammo ordered at [facility] (last 2013)  Labs/tests ordered:   Orders Placed This Encounter  Procedures  . Basic metabolic panel    This external order was created through the Results Console.  . Lipid panel    This external order was created through the Results Console.  . Hepatic function panel    This external order was created through the Results Console.  . TSH    This external order was created through the Results Console.  . Ambulatory referral to Dermatology    Referral Priority:  Routine    Referral Type:  Consultation    Referral Reason:  Specialty Services Required    Requested Specialty:  Dermatology    Number of Visits Requested:  1  diagnostic mammo also ordered at [facility] (external)  Next appt:  6 mos with labs before  [place]. Chanci Ojala, D.O. [facility] [address], [place] [address] Cell Phone (Mon-Fri 8am-5pm):  [phone] On Call:   [phone] & follow prompts after 5pm & weekends Office Phone:  [phone] Office Fax:  [phone]

## 2014-06-01 ENCOUNTER — Encounter: Payer: Self-pay | Admitting: Internal Medicine

## 2014-06-11 DIAGNOSIS — L570 Actinic keratosis: Secondary | ICD-10-CM | POA: Diagnosis not present

## 2014-06-11 DIAGNOSIS — L720 Epidermal cyst: Secondary | ICD-10-CM | POA: Diagnosis not present

## 2014-06-11 DIAGNOSIS — L820 Inflamed seborrheic keratosis: Secondary | ICD-10-CM | POA: Diagnosis not present

## 2014-06-17 ENCOUNTER — Telehealth: Payer: Self-pay

## 2014-06-17 ENCOUNTER — Other Ambulatory Visit: Payer: Self-pay | Admitting: *Deleted

## 2014-06-17 DIAGNOSIS — M545 Low back pain, unspecified: Secondary | ICD-10-CM

## 2014-06-17 MED ORDER — HYDROCODONE-ACETAMINOPHEN 10-325 MG PO TABS: ORAL_TABLET | ORAL | Status: DC

## 2014-06-17 NOTE — Telephone Encounter (Signed)
Patient requested and Cassandra Webb will take to [facility].

## 2014-06-17 NOTE — Telephone Encounter (Signed)
Patient called for refill on Hydrocodone (last refill 05/13/14). To be taken to [facility] tomorrow Tuesday 06/18/14, by Avel Sensor when she goes to [facility]. This is ok with patient.

## 2014-06-18 ENCOUNTER — Encounter: Payer: Self-pay | Admitting: Internal Medicine

## 2014-07-22 ENCOUNTER — Other Ambulatory Visit: Payer: Self-pay | Admitting: *Deleted

## 2014-07-22 DIAGNOSIS — M545 Low back pain, unspecified: Secondary | ICD-10-CM

## 2014-07-22 MED ORDER — HYDROCODONE-ACETAMINOPHEN 10-325 MG PO TABS: ORAL_TABLET | ORAL | Status: DC

## 2014-07-22 NOTE — Telephone Encounter (Signed)
Patient requested and [facility] will pic kup

## 2014-07-30 ENCOUNTER — Other Ambulatory Visit: Payer: Self-pay | Admitting: Internal Medicine

## 2014-07-31 ENCOUNTER — Other Ambulatory Visit: Payer: Self-pay | Admitting: *Deleted

## 2014-07-31 MED ORDER — RAMIPRIL 10 MG PO CAPS: ORAL_CAPSULE | ORAL | Status: DC

## 2014-07-31 MED ORDER — HYDROCHLOROTHIAZIDE 25 MG PO TABS: ORAL_TABLET | ORAL | Status: DC

## 2014-07-31 NOTE — Telephone Encounter (Signed)
[place] 

## 2014-08-13 ENCOUNTER — Other Ambulatory Visit: Payer: Self-pay | Admitting: Internal Medicine

## 2014-08-26 ENCOUNTER — Other Ambulatory Visit: Payer: Self-pay | Admitting: *Deleted

## 2014-08-26 DIAGNOSIS — M545 Low back pain, unspecified: Secondary | ICD-10-CM

## 2014-08-26 MED ORDER — HYDROCODONE-ACETAMINOPHEN 10-325 MG PO TABS: ORAL_TABLET | ORAL | Status: DC

## 2014-08-26 NOTE — Telephone Encounter (Signed)
Patient requested and [facility] will pick up

## 2014-09-09 ENCOUNTER — Other Ambulatory Visit: Payer: Self-pay | Admitting: *Deleted

## 2014-09-09 MED ORDER — BUTALBITAL-APAP-CAFFEINE 50-325-40 MG PO TABS: ORAL_TABLET | ORAL | Status: DC

## 2014-09-09 NOTE — Telephone Encounter (Signed)
[facility]  

## 2014-09-17 DIAGNOSIS — Z961 Presence of intraocular lens: Secondary | ICD-10-CM | POA: Diagnosis not present

## 2014-09-17 DIAGNOSIS — H10413 Chronic giant papillary conjunctivitis, bilateral: Secondary | ICD-10-CM | POA: Diagnosis not present

## 2014-09-17 DIAGNOSIS — H26492 Other secondary cataract, left eye: Secondary | ICD-10-CM | POA: Diagnosis not present

## 2014-09-17 DIAGNOSIS — H1851 Endothelial corneal dystrophy: Secondary | ICD-10-CM | POA: Diagnosis not present

## 2014-09-30 DIAGNOSIS — Z853 Personal history of malignant neoplasm of breast: Secondary | ICD-10-CM | POA: Diagnosis not present

## 2014-09-30 LAB — HM MAMMOGRAPHY

## 2014-10-01 ENCOUNTER — Encounter: Payer: Self-pay | Admitting: *Deleted

## 2014-10-02 ENCOUNTER — Non-Acute Institutional Stay: Payer: Medicare Other | Admitting: Internal Medicine

## 2014-10-02 ENCOUNTER — Encounter: Payer: Self-pay | Admitting: Internal Medicine

## 2014-10-02 VITALS — BP 124/70 | HR 72 | Temp 98.2°F | Resp 12 | Ht 62.0 in | Wt 150.0 lb

## 2014-10-02 DIAGNOSIS — B372 Candidiasis of skin and nail: Secondary | ICD-10-CM | POA: Diagnosis not present

## 2014-10-02 DIAGNOSIS — M545 Low back pain, unspecified: Secondary | ICD-10-CM

## 2014-10-02 DIAGNOSIS — G43709 Chronic migraine without aura, not intractable, without status migrainosus: Secondary | ICD-10-CM | POA: Diagnosis not present

## 2014-10-02 MED ORDER — NYSTATIN 100000 UNIT/GM EX CREA: 1.0000 "application " | TOPICAL_CREAM | Freq: Two times a day (BID) | CUTANEOUS | Status: DC

## 2014-10-02 MED ORDER — HYDROCODONE-ACETAMINOPHEN 10-325 MG PO TABS: ORAL_TABLET | ORAL | Status: DC

## 2014-10-02 MED ORDER — BUTALBITAL-APAP-CAFFEINE 50-325-40 MG PO TABS: ORAL_TABLET | ORAL | Status: DC

## 2014-10-02 MED ORDER — NYSTATIN 100000 UNIT/GM EX POWD: CUTANEOUS | Status: DC

## 2014-10-02 NOTE — Addendum Note (Signed)
Addended by: Hollace Kinnier L on: 10/02/2014 10:58 AM   Modules accepted: Level of Service

## 2014-10-02 NOTE — Progress Notes (Signed)
Patient ID: Cassandra Webb, female   DOB: December 13, 1933, 81 y.o.   MRN: [id number]    Location:  [facility]   Chief Complaint  Patient presents with  . Acute Visit    Rash under left breast x several weeks  . Medication Refill    Renew Hydrocodone and Butalb-Acetamin-Caff    HPI: Patient is a 81 y.o. white female seen in the [facility] today for an acute visit for rash beneath left breast that is now spreading beneath the right, as well.  She's been cleaning the area of moisture with paper towels and wearing a sports bra to keep her breasts from drooping.  The rash is somewhat tender.    Took femara for 5 years after her lumpectomy right.  Mammogram yesterday looked good, she says, but has yeast rash.  Tells me she has another one ordered though for some reason so need to see report and then she wants a copy, as well.  Continues with headaches brought on by bright lights.  Keeps a record of her use of the headache pills and seems to take them at least daily.  Appears attempts have been made to taper her off of these, but to no avail.    She uses hydrocodone approximately every 5 hours while awake for her chronic low back pain, as well.  Record shows she uses it regularly.  Review of Systems:  Review of Systems  Constitutional: Negative for fever and chills.  Eyes: Negative for blurred vision.  Respiratory: Negative for shortness of breath.   Cardiovascular: Negative for chest pain.  Gastrointestinal: Negative for abdominal pain.  Musculoskeletal: Positive for back pain. Negative for falls.  Skin: Positive for itching and rash.       Soreness under left breast and now spreading across midline  Neurological: Positive for headaches.  Psychiatric/Behavioral: Positive for depression. Negative for memory loss.    Past Medical History  Diagnosis Date  . Breast cancer 12/2007  . Tonsillitis   . Carpal tunnel syndrome of right wrist   . Cataracts, bilateral   .  Hypertension   . Back pain 2005  . Hyperlipidemia   . History of migraine headaches   . Lumbar herniated disc   . Fibromyalgia   . Pain in right knee 2008    Past Surgical History  Procedure Laterality Date  . Tonsillectomy    . Ankle surgery Right   . Cesarean section  1970  . Carpal tunnel release Right   . Lumbar disc surgery  2006    Dr. Gladstone Lighter  . Cataract extraction Bilateral 2008    Dr. Katy Fitch  . Breast lumpectomy with axillary lymph node biopsy  12/08, 12/09    Dr. Margot Chimes     Social History:   reports that she has never smoked. She has never used smokeless tobacco. She reports that she does not drink alcohol or use illicit drugs.  Allergies  Allergen Reactions  . Milk-Related Compounds     Causes sinus symptoms when she has milk products     Medications: Patient's Medications  New Prescriptions   No medications on file  Previous Medications   AMLODIPINE (NORVASC) 10 MG TABLET    Take one tablet by mouth once daily to control blood pressure   BUTALBITAL-ACETAMINOPHEN-CAFFEINE (FIORICET, ESGIC) 50-325-40 MG PER TABLET    Take one tablet by mouth up to four times daily as needed for pain or headache   HYDROCHLOROTHIAZIDE (HYDRODIURIL) 25 MG TABLET  Take one tablet by mouth once daily   HYDROCODONE-ACETAMINOPHEN (NORCO) 10-325 MG PER TABLET    Take one tablet by mouth every 4 hours as needed for pain   METOPROLOL SUCCINATE (TOPROL-XL) 100 MG 24 HR TABLET    TAKE 1/2 TO 1 TABLET ONCE DAILY FOR HIGH BLOOD PRESSURE.   POLYETHYLENE GLYCOL (MIRALAX / GLYCOLAX) PACKET    Take 17 g by mouth daily.   RAMIPRIL (ALTACE) 10 MG CAPSULE    Take one capsule by mouth three times daily   VITAMIN D, ERGOCALCIFEROL, (DRISDOL) 50000 UNITS CAPS CAPSULE    TAKE 1 CAPSULE ONCE A WEEK.  Modified Medications   No medications on file  Discontinued Medications   CLOTRIMAZOLE-BETAMETHASONE (LOTRISONE) CREAM    Apply 1 application topically 2 (two) times daily. To left axilla until rash  resolves   CYCLOBENZAPRINE (FLEXERIL) 10 MG TABLET    Take 1/2  to 1 three times daily as needed to relax muscles     Physical Exam: Filed Vitals:   10/02/14 0834  BP: 124/70  Pulse: 72  Temp: 98.2 F (36.8 C)  TempSrc: Oral  Resp: 12  Height: 5\' 2"  (1.575 m)  Weight: 150 lb (68.04 kg)  SpO2: 97%   Body mass index is 27.43 kg/(m^2). Physical Exam  Constitutional: She is oriented to person, place, and time. She appears well-developed and well-nourished. No distress.  HENT:  Head: Normocephalic and atraumatic.  Neurological: She is alert and oriented to person, place, and time.  Skin: Rash noted. There is erythema.  Moist, macerated erythematous patch beneath left breast and spreading across midline slightly  Psychiatric:  Flat affect     Labs reviewed: Basic Metabolic Panel:  Recent Labs  05/16/14  NA 135*  K 4.5  BUN 17  CREATININE 0.7  TSH 2.45   Liver Function Tests:  Recent Labs  05/16/14  AST 12*  ALT 15  ALKPHOS 66   No results for input(s): LIPASE, AMYLASE in the last 8760 hours. No results for input(s): AMMONIA in the last 8760 hours. CBC: No results for input(s): WBC, NEUTROABS, HGB, HCT, MCV, PLT in the last 8760 hours. Lipid Panel:  Recent Labs  05/16/14  CHOL 201*  HDL 32*  LDLCALC 102  TRIG 322*   No results found for: HGBA1C  Procedures since last appt:  Patient Care Team: Gayland Curry, DO as PCP - General (Geriatric Medicine) [facility] Latanya Maudlin, MD as Consulting Physician (Orthopedic Surgery) Chauncey Cruel, MD as Consulting Physician (Oncology) Clent Jacks, MD as Consulting Physician (Ophthalmology)  Assessment/Plan 1. Midline low back pain without sciatica - ongoing -continues on her chronic hydrocodone which hard script was given today  - HYDROcodone-acetaminophen (NORCO) 10-325 MG per tablet; Take one tablet by mouth every 4 hours as needed for pain  Dispense: 180 tablet; Refill: 0  2.  Candidal skin infection -try nystatin cream bid after bathing the area beneath her breasts, but if not effective after 1-2 wks, switch to the powder (I recommended this first due to the large amount of moisture, but she wanted cream)  3. Chronic migraine without aura without status migrainosus, not intractable -ongoing, brought on by light -probably due to chronic med use -cont fioricet for now--would recommend she see a headache specialist  Requests mammogram copy when I get report.  Was done 09/30/14.    Next appt:  Keep physical in Oct  Amie Cowens L. Karna Abed, D.O. [facility] [phone]  [address], [place] [address] Cell Phone (Mon-Fri 8am-5pm):  [phone] On Call:  [phone] & follow prompts after 5pm & weekends Office Phone:  [phone] Office Fax:  [phone]

## 2014-11-05 ENCOUNTER — Other Ambulatory Visit: Payer: Self-pay | Admitting: *Deleted

## 2014-11-05 DIAGNOSIS — M545 Low back pain, unspecified: Secondary | ICD-10-CM

## 2014-11-05 MED ORDER — HYDROCODONE-ACETAMINOPHEN 10-325 MG PO TABS: ORAL_TABLET | ORAL | Status: DC

## 2014-11-05 NOTE — Telephone Encounter (Signed)
Patient requested and wants it brought to [facility]. Given to [place] To take with her to Clinic tomorrow. Patient called and stated that she will have [facility] pick it up here at the office today.

## 2014-12-03 ENCOUNTER — Encounter: Payer: Self-pay | Admitting: Internal Medicine

## 2014-12-04 ENCOUNTER — Encounter: Payer: Medicare Other | Admitting: Internal Medicine

## 2014-12-10 ENCOUNTER — Other Ambulatory Visit: Payer: Self-pay | Admitting: *Deleted

## 2014-12-10 DIAGNOSIS — M545 Low back pain, unspecified: Secondary | ICD-10-CM

## 2014-12-10 MED ORDER — HYDROCODONE-ACETAMINOPHEN 10-325 MG PO TABS: ORAL_TABLET | ORAL | Status: DC

## 2014-12-10 NOTE — Telephone Encounter (Signed)
Patient requested and [facility] will pick up 

## 2015-01-14 ENCOUNTER — Other Ambulatory Visit: Payer: Self-pay | Admitting: *Deleted

## 2015-01-14 DIAGNOSIS — M545 Low back pain, unspecified: Secondary | ICD-10-CM

## 2015-01-14 MED ORDER — HYDROCODONE-ACETAMINOPHEN 10-325 MG PO TABS: ORAL_TABLET | ORAL | Status: DC

## 2015-01-14 NOTE — Telephone Encounter (Signed)
Patient requested and [facility] will pick up 

## 2015-01-20 ENCOUNTER — Other Ambulatory Visit: Payer: Self-pay | Admitting: Internal Medicine

## 2015-02-12 ENCOUNTER — Other Ambulatory Visit: Payer: Self-pay | Admitting: *Deleted

## 2015-02-12 DIAGNOSIS — M545 Low back pain, unspecified: Secondary | ICD-10-CM

## 2015-02-12 MED ORDER — BUTALBITAL-APAP-CAFFEINE 50-325-40 MG PO TABS: ORAL_TABLET | ORAL | Status: DC

## 2015-02-13 ENCOUNTER — Other Ambulatory Visit: Payer: Self-pay | Admitting: *Deleted

## 2015-02-13 DIAGNOSIS — M545 Low back pain, unspecified: Secondary | ICD-10-CM

## 2015-02-13 MED ORDER — BUTALBITAL-APAP-CAFFEINE 50-325-40 MG PO TABS: ORAL_TABLET | ORAL | Status: DC

## 2015-02-13 MED ORDER — HYDROCODONE-ACETAMINOPHEN 10-325 MG PO TABS: ORAL_TABLET | ORAL | Status: DC

## 2015-02-25 ENCOUNTER — Other Ambulatory Visit: Payer: Self-pay | Admitting: Nurse Practitioner

## 2015-02-25 ENCOUNTER — Other Ambulatory Visit: Payer: Self-pay | Admitting: Internal Medicine

## 2015-03-13 ENCOUNTER — Other Ambulatory Visit: Payer: Self-pay | Admitting: Internal Medicine

## 2015-03-25 ENCOUNTER — Other Ambulatory Visit: Payer: Self-pay

## 2015-03-25 DIAGNOSIS — M545 Low back pain, unspecified: Secondary | ICD-10-CM

## 2015-03-25 MED ORDER — HYDROCODONE-ACETAMINOPHEN 10-325 MG PO TABS: ORAL_TABLET | ORAL | Status: DC

## 2015-03-25 NOTE — Telephone Encounter (Signed)
Pt requested to have [facility] to pick up Rx.

## 2015-04-07 ENCOUNTER — Other Ambulatory Visit: Payer: Self-pay | Admitting: Internal Medicine

## 2015-04-25 ENCOUNTER — Other Ambulatory Visit: Payer: Self-pay | Admitting: Internal Medicine

## 2015-04-25 ENCOUNTER — Telehealth: Payer: Self-pay

## 2015-04-25 MED ORDER — BUTALBITAL-APAP-CAFFEINE 50-325-40 MG PO TABS: ORAL_TABLET | ORAL | Status: DC

## 2015-04-25 NOTE — Telephone Encounter (Signed)
Patient has an appointment for 04/30/15, next Wednesday. Patient states she will keep appointment. Rx called in for a 1/2 month supply to assure patient keeps appointment

## 2015-04-25 NOTE — Telephone Encounter (Signed)
Agree that she needs an appt.  Await response.

## 2015-04-25 NOTE — Telephone Encounter (Signed)
Call received from patient's pharmacy indicating that we denied rx for Butalbital saying it was too early yet it is not because we only provided patient with a 1/2 month supply on 04/07/15. I informed the pharmacy that the patient is due for an appointment and I will follow-up with the patient first.   Left message on voicemail for patient to return call when available , reason for call: Patient must schedule an appointment in order to continue to receive refills. Message with be sent to patient's PCP to determine if medication can be fill although appointment overdue.   Last OV 10/02/2014 - Acute Visit  Prior OV 05/28/14- Med mgt of chronic issues

## 2015-04-30 ENCOUNTER — Non-Acute Institutional Stay: Payer: Medicare Other | Admitting: Internal Medicine

## 2015-04-30 ENCOUNTER — Encounter: Payer: Self-pay | Admitting: Internal Medicine

## 2015-04-30 VITALS — BP 140/80 | HR 60 | Temp 98.0°F | Ht 62.0 in | Wt 147.0 lb

## 2015-04-30 DIAGNOSIS — E785 Hyperlipidemia, unspecified: Secondary | ICD-10-CM | POA: Diagnosis not present

## 2015-04-30 DIAGNOSIS — I10 Essential (primary) hypertension: Secondary | ICD-10-CM

## 2015-04-30 DIAGNOSIS — M797 Fibromyalgia: Secondary | ICD-10-CM | POA: Diagnosis not present

## 2015-04-30 DIAGNOSIS — M545 Low back pain, unspecified: Secondary | ICD-10-CM

## 2015-04-30 DIAGNOSIS — G43709 Chronic migraine without aura, not intractable, without status migrainosus: Secondary | ICD-10-CM | POA: Diagnosis not present

## 2015-04-30 MED ORDER — AMLODIPINE BESYLATE 10 MG PO TABS: 10.0000 mg | ORAL_TABLET | Freq: Every day | ORAL | Status: DC

## 2015-04-30 MED ORDER — METOPROLOL SUCCINATE ER 100 MG PO TB24: ORAL_TABLET | ORAL | Status: DC

## 2015-04-30 MED ORDER — HYDROCHLOROTHIAZIDE 25 MG PO TABS: 25.0000 mg | ORAL_TABLET | Freq: Every day | ORAL | Status: DC

## 2015-04-30 MED ORDER — RAMIPRIL 10 MG PO CAPS: ORAL_CAPSULE | ORAL | Status: DC

## 2015-04-30 MED ORDER — HYDROCODONE-ACETAMINOPHEN 10-325 MG PO TABS: ORAL_TABLET | ORAL | Status: DC

## 2015-04-30 MED ORDER — CYCLOBENZAPRINE HCL 10 MG PO TABS: ORAL_TABLET | ORAL | Status: DC

## 2015-04-30 NOTE — Progress Notes (Signed)
Patient ID: Cassandra Webb, female   DOB: Oct 14, 1933, 82 y.o.   MRN: [id number]   Location:  [facility] (12)  Provider: Jonmichael Beadnell L. Mariea Clonts, D.O., C.M.D.  Goals of Care:  Advanced Directives 05/28/2014  Does patient have an advance directive? Yes  Type of Advance Directive Living will;Healthcare Power of Attorney  Does patient want to make changes to advanced directive? No - Patient declined  Copy of advanced directive(s) in chart? Yes     Chief Complaint  Patient presents with  . Medical Management of Chronic Issues    medication management, 6 month     HPI: Patient is a 82 y.o. female seen today for medical management of chronic diseases--not seen for 6 mos.    Is annoyed that she had to be seen in order to get her medications.  Explained that this is for her safety and due to regulations.  Says she's enraged about the treatment she gets here.    Says she is doing fine.  Is falling asleep in the chair.  Says as long as she can get the medicine she needs, she does fine.  Just had fioricet filled on 2/24--pharmacist confirms it was picked up just after 5pm that day #60.  Headaches--says it's due to large windows across the whole side of the wall.  Gets them daily.  Says they have blinds, but there is a lot of glare coming in.  No further fioricet was given due to just having gotten 60 pills 2/24.  When she uses those, a full one month supply should be provided with 5 refills to keep her until her next appt.  Takes her norco due to her back surgery she had for her herniated lumbar disc.  Given hydrocodone Rx handwritten today.    Avoids the dining room b/c it's too noisy.  Pays to have the meals delivered.  Refuses flu shot b/c she's not in contact with other people.  BP at upper limits of normal with altace, toprol, norvasc.    Rash beneath breasts has cleared up.    Bowels doing fine when she uses her miralax.  Does not get cramps,  just normal bowels.    Past Medical History  Diagnosis Date  . Breast cancer ([place]) 12/2007  . Tonsillitis   . Carpal tunnel syndrome of right wrist   . Cataracts, bilateral   . Hypertension   . Back pain 2005  . Hyperlipidemia   . History of migraine headaches   . Lumbar herniated disc   . Fibromyalgia   . Pain in right knee 2008    Past Surgical History  Procedure Laterality Date  . Tonsillectomy    . Ankle surgery Right   . Cesarean section  1970  . Carpal tunnel release Right   . Lumbar disc surgery  2006    Dr. Gladstone Lighter  . Cataract extraction Bilateral 2008    Dr. Katy Fitch  . Breast lumpectomy with axillary lymph node biopsy  12/08, 12/09    Dr. Margot Chimes     Allergies  Allergen Reactions  . Milk-Related Compounds     Causes sinus symptoms when she has milk products       Medication List       This list is accurate as of: 04/30/15  9:54 AM.  Always use your most recent med list.               amLODipine 10 MG tablet  Commonly known as:  NORVASC  TAKE 1 TABLET ONCE DAILY.     butalbital-acetaminophen-caffeine 50-325-40 MG tablet  Commonly known as:  FIORICET, ESGIC  TAKE 1 TABLET 4 TIMES A DAY AS NEEDED FOR SEVERE HEADACHE.     cyclobenzaprine 10 MG tablet  Commonly known as:  FLEXERIL  Take 10 mg by mouth. 1/2 to 1 table three times daily as needed for muscle tension     hydrochlorothiazide 25 MG tablet  Commonly known as:  HYDRODIURIL  TAKE 1 TABLET ONCE DAILY.     HYDROcodone-acetaminophen 10-325 MG tablet  Commonly known as:  NORCO  Take one tablet by mouth every 4 hours as needed for pain     metoprolol succinate 100 MG 24 hr tablet  Commonly known as:  TOPROL-XL  TAKE 1/2 TO 1 TABLET ONCE DAILY FOR HIGH BLOOD PRESSURE.     nystatin 100000 UNIT/GM Powd  One application beneath breasts twice daily until rash resolves     nystatin cream  Commonly known as:  MYCOSTATIN  Apply 1 application topically 2 (two) times daily.     polyethylene  glycol packet  Commonly known as:  MIRALAX / GLYCOLAX  Take 17 g by mouth daily.     ramipril 10 MG capsule  Commonly known as:  ALTACE  Take one capsule by mouth three times daily     Vitamin D (Ergocalciferol) 50000 units Caps capsule  Commonly known as:  DRISDOL  TAKE 1 CAPSULE ONCE A WEEK.        Review of Systems:  Review of Systems  Constitutional: Negative for activity change, appetite change and unexpected weight change.  Respiratory: Negative for shortness of breath.   Cardiovascular: Negative for chest pain and leg swelling.  Gastrointestinal: Positive for constipation.       [place] with miralax  Genitourinary: Negative for dysuria and urgency.  Musculoskeletal: Positive for myalgias, back pain, arthralgias, gait problem and neck pain.  Skin: Positive for pallor.  Neurological: Positive for headaches.  Psychiatric/Behavioral: Positive for dysphoric mood. Negative for confusion.       Denies depression    Health Maintenance  Topic Date Due  . TETANUS/TDAP  07/17/1954  . ZOSTAVAX  07/17/1995  . PNA vac Low Risk Adult (2 of 2 - PCV13) 03/01/2008  . INFLUENZA VACCINE  09/30/2014  . DEXA SCAN  Completed    Physical Exam: Filed Vitals:   04/30/15 0911  BP: 140/80  Pulse: 60  Temp: 98 F (36.7 C)  TempSrc: Oral  Height: 5\' 2"  (1.575 m)  Weight: 147 lb (66.679 kg)  SpO2: 99%   Body mass index is 26.88 kg/(m^2). Physical Exam  Labs reviewed: Basic Metabolic Panel:  Recent Labs  05/16/14  NA 135*  K 4.5  BUN 17  CREATININE 0.7  TSH 2.45   Liver Function Tests:  Recent Labs  05/16/14  AST 12*  ALT 15  ALKPHOS 66   No results for input(s): LIPASE, AMYLASE in the last 8760 hours. No results for input(s): AMMONIA in the last 8760 hours. CBC: No results for input(s): WBC, NEUTROABS, HGB, HCT, MCV, PLT in the last 8760 hours. Lipid Panel:  Recent Labs  05/16/14  CHOL 201*  HDL 32*  LDLCALC 102  TRIG 322*   Assessment/Plan 1. Midline low  back pain without sciatica - says she is unable to do anything but transfer--cannot walk due to pain - continue HYDROcodone-acetaminophen (NORCO) 10-325 MG tablet; Take one tablet by mouth every 4 hours as needed  for pain  Dispense: 180 tablet; Refill: 0  2. Chronic migraine without aura without status migrainosus, not intractable -fioricet has been used for years-refuses to consider other therapies -next time it's due, she needs her full month supply with 5 refills to get her through to her physical  3. Essential hypertension -bp at upper limits of normal with her current meds--these were renewed  4. Fibromyalgia -ongoing problem, has chronic pain and seems to have severe depression, but blames how she feels on everything around her--WS and how we didn't fill her full month supply of medication when she kept canceling appts  5. Hyperlipidemia -refuses statin therapy despite elevated LDL levels  Labs/tests ordered:  Cbc, cmp, flp Next appt:  6 mos for her annual exam   Alahni Varone L. Elanie Hammitt, D.O. [facility] [address], [place] [address] Cell Phone (Mon-Fri 8am-5pm):  [phone] On Call:  [phone] & follow prompts after 5pm & weekends Office Phone:  [phone] Office Fax:  [phone]

## 2015-05-13 ENCOUNTER — Other Ambulatory Visit: Payer: Self-pay | Admitting: *Deleted

## 2015-05-13 MED ORDER — BUTALBITAL-APAP-CAFFEINE 50-325-40 MG PO TABS: ORAL_TABLET | ORAL | Status: DC

## 2015-05-13 NOTE — Telephone Encounter (Signed)
Patient called and requested headache medication to be called to pharmacy. Reviewed Dr. Cyndi Lennert last OV note and it stated to call in for months worth with 5 refills to last until patient's physical appointment. Phoned into [facility].

## 2015-06-09 ENCOUNTER — Other Ambulatory Visit: Payer: Self-pay | Admitting: Internal Medicine

## 2015-06-10 ENCOUNTER — Other Ambulatory Visit: Payer: Self-pay | Admitting: *Deleted

## 2015-06-10 MED ORDER — HYDROCODONE-ACETAMINOPHEN 10-325 MG PO TABS: ORAL_TABLET | ORAL | Status: DC

## 2015-07-22 ENCOUNTER — Other Ambulatory Visit: Payer: Self-pay

## 2015-07-22 MED ORDER — HYDROCODONE-ACETAMINOPHEN 10-325 MG PO TABS: ORAL_TABLET | ORAL | Status: DC

## 2015-07-22 NOTE — Telephone Encounter (Signed)
Patient informed we will call when rx is signed and ready for pick-up

## 2015-07-24 ENCOUNTER — Telehealth: Payer: Self-pay | Admitting: *Deleted

## 2015-07-24 DIAGNOSIS — Z0110 Encounter for examination of ears and hearing without abnormal findings: Secondary | ICD-10-CM

## 2015-07-24 NOTE — Telephone Encounter (Signed)
Patient called and stated that she has an appointment with [facility] 07/29/15 @2 :30 and needs a referral.  Referral placed.

## 2015-07-29 DIAGNOSIS — H903 Sensorineural hearing loss, bilateral: Secondary | ICD-10-CM | POA: Diagnosis not present

## 2015-08-27 ENCOUNTER — Other Ambulatory Visit: Payer: Self-pay | Admitting: *Deleted

## 2015-08-27 MED ORDER — HYDROCODONE-ACETAMINOPHEN 10-325 MG PO TABS: ORAL_TABLET | ORAL | Status: DC

## 2015-08-27 NOTE — Telephone Encounter (Signed)
Patient requested and [facility] will pick up 

## 2015-09-24 DIAGNOSIS — H10413 Chronic giant papillary conjunctivitis, bilateral: Secondary | ICD-10-CM | POA: Diagnosis not present

## 2015-09-24 DIAGNOSIS — H1851 Endothelial corneal dystrophy: Secondary | ICD-10-CM | POA: Diagnosis not present

## 2015-09-24 DIAGNOSIS — Z961 Presence of intraocular lens: Secondary | ICD-10-CM | POA: Diagnosis not present

## 2015-09-24 DIAGNOSIS — H04123 Dry eye syndrome of bilateral lacrimal glands: Secondary | ICD-10-CM | POA: Diagnosis not present

## 2015-10-07 ENCOUNTER — Other Ambulatory Visit: Payer: Self-pay | Admitting: *Deleted

## 2015-10-07 MED ORDER — HYDROCODONE-ACETAMINOPHEN 10-325 MG PO TABS: ORAL_TABLET | ORAL | 0 refills | Status: DC

## 2015-10-07 MED ORDER — BUTALBITAL-APAP-CAFFEINE 50-325-40 MG PO TABS: ORAL_TABLET | ORAL | 3 refills | Status: DC

## 2015-10-07 NOTE — Telephone Encounter (Signed)
Patient requested and will have [facility] to pick up

## 2015-10-21 ENCOUNTER — Other Ambulatory Visit: Payer: Self-pay | Admitting: Internal Medicine

## 2015-11-06 ENCOUNTER — Encounter: Payer: Self-pay | Admitting: Internal Medicine

## 2015-11-06 DIAGNOSIS — I10 Essential (primary) hypertension: Secondary | ICD-10-CM | POA: Diagnosis not present

## 2015-11-06 DIAGNOSIS — E785 Hyperlipidemia, unspecified: Secondary | ICD-10-CM | POA: Diagnosis not present

## 2015-11-06 LAB — HEPATIC FUNCTION PANEL
ALT: 15 U/L (ref 7–35)
AST: 13 U/L (ref 13–35)
Alkaline Phosphatase: 67 U/L (ref 25–125)
Bilirubin, Total: 0.2 mg/dL

## 2015-11-06 LAB — CBC AND DIFFERENTIAL
HCT: 38 % (ref 36–46)
Hemoglobin: 13.4 g/dL (ref 12.0–16.0)
Platelets: 214 10*3/uL (ref 150–399)
WBC: 7.1 10^3/mL

## 2015-11-06 LAB — LIPID PANEL
Cholesterol: 186 mg/dL (ref 0–200)
HDL: 47 mg/dL (ref 35–70)
LDL Cholesterol: 88 mg/dL
Triglycerides: 256 mg/dL — AB (ref 40–160)

## 2015-11-06 LAB — BASIC METABOLIC PANEL
BUN: 14 mg/dL (ref 4–21)
Creatinine: 0.6 mg/dL (ref 0.5–1.1)
Glucose: 98 mg/dL
Potassium: 5.2 mmol/L (ref 3.4–5.3)
Sodium: 140 mmol/L (ref 137–147)

## 2015-11-12 ENCOUNTER — Non-Acute Institutional Stay: Payer: Medicare Other | Admitting: Internal Medicine

## 2015-11-12 ENCOUNTER — Encounter: Payer: Self-pay | Admitting: Internal Medicine

## 2015-11-12 VITALS — BP 118/60 | HR 68 | Temp 98.4°F | Ht 62.0 in | Wt 146.0 lb

## 2015-11-12 DIAGNOSIS — I10 Essential (primary) hypertension: Secondary | ICD-10-CM | POA: Diagnosis not present

## 2015-11-12 DIAGNOSIS — M797 Fibromyalgia: Secondary | ICD-10-CM | POA: Diagnosis not present

## 2015-11-12 DIAGNOSIS — M545 Low back pain, unspecified: Secondary | ICD-10-CM

## 2015-11-12 DIAGNOSIS — E039 Hypothyroidism, unspecified: Secondary | ICD-10-CM | POA: Diagnosis not present

## 2015-11-12 DIAGNOSIS — R350 Frequency of micturition: Secondary | ICD-10-CM | POA: Diagnosis not present

## 2015-11-12 MED ORDER — METOPROLOL SUCCINATE ER 50 MG PO TB24: ORAL_TABLET | ORAL | 5 refills | Status: DC

## 2015-11-12 MED ORDER — MIRABEGRON ER 25 MG PO TB24: 25.0000 mg | ORAL_TABLET | Freq: Every day | ORAL | 3 refills | Status: DC

## 2015-11-12 MED ORDER — CYCLOBENZAPRINE HCL 10 MG PO TABS: ORAL_TABLET | ORAL | 0 refills | Status: DC

## 2015-11-12 MED ORDER — VITAMIN D (ERGOCALCIFEROL) 1.25 MG (50000 UNIT) PO CAPS: ORAL_CAPSULE | ORAL | 1 refills | Status: DC

## 2015-11-12 MED ORDER — HYDROCODONE-ACETAMINOPHEN 10-325 MG PO TABS: ORAL_TABLET | ORAL | 0 refills | Status: DC

## 2015-11-12 NOTE — Progress Notes (Signed)
Location:   [place] [facility] Service:  Clinic (12)  Provider: Avaleen Brownley L. Mariea Clonts, D.O., C.M.D.  Code Status: DNR Goals of Care:  Advanced Directives 11/12/2015  Does patient have an advance directive? Yes  Type of Advance Directive [place]  Does patient want to make changes to advanced directive? -  Copy of advanced directive(s) in chart? Yes   Chief Complaint  Patient presents with  . Medical Management of Chronic Issues    6 mth follow-up    HPI: Patient is a 82 y.o. female seen today for medical management of chronic diseases.    Pt accidentally canceled her physical after she got the telephone recording and hit the wrong button b/c she cannot hear.  She got brought to the office instead of here. Then her appt was supposed to be a physical and now it's just a routine visit b/c of timing.    Reports she used to take a diuretic and she took one yesterday.  She suddenly has to use to the bathroom.  Can't even make it a few feet.  Wet her pants three times yesterday.  Still has frequency w/o the diuretic.  She also went before leaving home today.  No dysuria.  No abdominal pain.  No fever, chills.    Says she feels great outside of her bad knees.  Enjoys football season.    Gets worse back pain unless she elevates her feet.  Still has to take her medication.  Stretches it to 5 hours between.  Keeps track of her medication--records when she takes it.    Still gets headaches from the big windows in her apt.  Takes her medication and feels better.  Her son bought a divider to cut out the direct glare.    HTN:  bp at goal.  Past Medical History:  Diagnosis Date  . Back pain 2005  . Breast cancer ([place]) 12/2007  . Carpal tunnel syndrome of right wrist   . Cataracts, bilateral   . Fibromyalgia   . History of migraine headaches   . Hyperlipidemia   . Hypertension   . Lumbar herniated disc   . Pain in right knee 2008  . Tonsillitis     Past Surgical  History:  Procedure Laterality Date  . ANKLE SURGERY Right   . BREAST LUMPECTOMY WITH AXILLARY LYMPH NODE BIOPSY  12/08, 12/09   Dr. Margot Chimes   . CARPAL TUNNEL RELEASE Right   . CATARACT EXTRACTION Bilateral 2008   Dr. Katy Fitch  . CESAREAN SECTION  1970  . LUMBAR DISC SURGERY  2006   Dr. Gladstone Lighter  . TONSILLECTOMY      Allergies  Allergen Reactions  . Milk-Related Compounds     Causes sinus symptoms when she has milk products       Medication List       Accurate as of 11/12/15 11:19 AM. Always use your most recent med list.          amLODipine 10 MG tablet Commonly known as:  NORVASC Take 1 tablet (10 mg total) by mouth daily.   butalbital-acetaminophen-caffeine 50-325-40 MG tablet Commonly known as:  FIORICET, ESGIC TAKE 1 TABLET 4 TIMES A DAY AS NEEDED FOR SEVERE HEADACHE.   cromolyn 4 % ophthalmic solution Commonly known as:  OPTICROM Place 1 drop into both eyes daily.   cyclobenzaprine 10 MG tablet Commonly known as:  FLEXERIL 1/2 to 1 table three times daily as needed for muscle tension  HYDROcodone-acetaminophen 10-325 MG tablet Commonly known as:  NORCO Take one tablet by mouth every four hours as needed for pain   metoprolol succinate 100 MG 24 hr tablet Commonly known as:  TOPROL-XL TAKE 1/2 TO 1 TABLET ONCE DAILY FOR HIGH BLOOD PRESSURE.   naproxen sodium 220 MG tablet Commonly known as:  ANAPROX Take 220 mg by mouth 2 (two) times daily with a meal.   polyethylene glycol packet Commonly known as:  MIRALAX / GLYCOLAX Take 17 g by mouth daily.   ramipril 10 MG capsule Commonly known as:  ALTACE TAKE (1) CAPSULE THREE TIMES DAILY.   Vitamin D (Ergocalciferol) 50000 units Caps capsule Commonly known as:  DRISDOL TAKE 1 CAPSULE ONCE A WEEK.       Review of Systems:  Review of Systems  Constitutional: Positive for malaise/fatigue. Negative for chills, fever and weight loss.  HENT: Positive for hearing loss. Negative for congestion and sore  throat.   Eyes: Negative for blurred vision.       Glasses  Respiratory: Negative for cough, shortness of breath and wheezing.   Cardiovascular: Negative for chest pain, palpitations and leg swelling.  Gastrointestinal: Negative for abdominal pain.  Genitourinary: Positive for frequency. Negative for dysuria, flank pain, hematuria and urgency.  Musculoskeletal: Positive for back pain, joint pain and myalgias. Negative for falls.  Skin: Negative for rash.  Neurological: Positive for weakness. Negative for dizziness and loss of consciousness.  Endo/Heme/Allergies: Does not bruise/bleed easily.  Psychiatric/Behavioral: Positive for depression. Negative for memory loss.    Health Maintenance  Topic Date Due  . TETANUS/TDAP  07/17/1954  . ZOSTAVAX  07/17/1995  . PNA vac Low Risk Adult (2 of 2 - PCV13) 03/01/2008  . INFLUENZA VACCINE  09/30/2015  . DEXA SCAN  Completed    Physical Exam: Vitals:   11/12/15 1105  BP: 118/60  Pulse: 68  Temp: 98.4 F (36.9 C)  TempSrc: Oral  SpO2: 97%  Weight: 146 lb (66.2 kg)  Height: 5\' 2"  (1.575 m)   Body mass index is 26.7 kg/m. Physical Exam  Constitutional: She appears well-developed and well-nourished. No distress.  Cardiovascular: Normal rate, regular rhythm, normal heart sounds and intact distal pulses.   Pulmonary/Chest: Effort normal and breath sounds normal. No respiratory distress.  Abdominal: Soft. Bowel sounds are normal.  Musculoskeletal: Normal range of motion. She exhibits tenderness.  Neurological: She is alert.  Skin: Skin is warm and dry.  Psychiatric: She has a normal mood and affect.  Very pleasant today despite circumstances with appt    Labs reviewed: Basic Metabolic Panel:  Recent Labs  11/06/15 1018  NA 140  K 5.2  BUN 14  CREATININE 0.6   Liver Function Tests:  Recent Labs  11/06/15 1018  AST 13  ALT 15  ALKPHOS 67   No results for input(s): LIPASE, AMYLASE in the last 8760 hours. No results for  input(s): AMMONIA in the last 8760 hours. CBC:  Recent Labs  11/06/15 1018  WBC 7.1  HGB 13.4  HCT 38  PLT 214   Lipid Panel:  Recent Labs  11/06/15 1018  CHOL 186  HDL 47  LDLCALC 88  TRIG 256*   Assessment/Plan 1. Urinary frequency - no signs of UTI - will treat as OAB and see if she has an improvement - mirabegron ER (MYRBETRIQ) 25 MG TB24 tablet; Take 1 tablet (25 mg total) by mouth daily.  Dispense: 30 tablet; Refill: 3  2. Fibromyalgia - ongoing, has days of  muscle spasms for which she uses the flexeril - cyclobenzaprine (FLEXERIL) 10 MG tablet; 1/2 to 1 table three times daily as needed for muscle tension  Dispense: 90 tablet; Refill: 0  3. Midline low back pain without sciatica -does ok when she elevates her legs, uses pain meds occasionally for this also - cyclobenzaprine (FLEXERIL) 10 MG tablet; 1/2 to 1 table three times daily as needed for muscle tension  Dispense: 90 tablet; Refill: 0  4. Hypothyroidism, unspecified hypothyroidism type -cont current thyroid medication and monitor  5. Essential hypertension - cont current regimen, uses only 50mg  every day so changed tablet size b/c she was cutting the XL tablets; keep ramipril and amlodipine the same also - metoprolol succinate (TOPROL-XL) 50 MG 24 hr tablet; 1 TABLET ONCE DAILY FOR HIGH BLOOD PRESSURE.  Dispense: 30 tablet; Refill: 5  Labs/tests ordered:   Orders Placed This Encounter  Procedures  . CBC and differential    This external order was created through the Results Console.  . Basic metabolic panel    This external order was created through the Results Console.  . Lipid panel    This external order was created through the Results Console.  . Hepatic function panel    This external order was created through the Results Console.    Next appt:  6 mos for annual wellness/CPE b/c appt today was scheduled as routine  Jonthan Leite L. Shardae Kleinman, D.O. [facility] [address], [place] [address] Cell Phone (Mon-Fri 8am-5pm):  [phone] On Call:  [phone] & follow prompts after 5pm & weekends Office Phone:  [phone] Office Fax:  [phone]

## 2015-11-20 ENCOUNTER — Telehealth: Payer: Self-pay

## 2015-11-20 NOTE — Telephone Encounter (Signed)
Discussed with patient. Patient states she can void yet its only a little. Patient will see supervising nurse at [facility] if she feels the need to

## 2015-11-20 NOTE — Telephone Encounter (Signed)
Message left on triage voicemail: Patient was giving medication for OAB and now patient is having an adverse reaction. When patient has the urge to void she is not able to go, nothing comes out.  Please review and advise

## 2015-11-20 NOTE — Telephone Encounter (Signed)
Stop the medication immediately (d/c myrbetriq).  She's developed retention.  If she is unable to void at all, she may need to see the clinic nurse or nursing supervisor in health care to get catheterized--she lives at [facility].

## 2015-12-17 ENCOUNTER — Other Ambulatory Visit: Payer: Self-pay | Admitting: *Deleted

## 2015-12-17 MED ORDER — HYDROCODONE-ACETAMINOPHEN 10-325 MG PO TABS
ORAL_TABLET | ORAL | 0 refills | Status: DC
Start: 2015-12-17 — End: 2015-12-17

## 2015-12-17 MED ORDER — HYDROCODONE-ACETAMINOPHEN 10-325 MG PO TABS: ORAL_TABLET | ORAL | 0 refills | Status: DC

## 2015-12-17 NOTE — Telephone Encounter (Signed)
Patient requested 

## 2015-12-18 ENCOUNTER — Telehealth: Payer: Self-pay

## 2015-12-18 NOTE — Telephone Encounter (Signed)
I called patient to let her know that there is a prescription for hydrocodone/APAP 10-325 mg ready to be picked up at the office. Patient stated that she would have someone from [facility] to come pick up the prescription.   Prescription was placed in the filing cabinet at the front desk.

## 2016-01-20 ENCOUNTER — Other Ambulatory Visit: Payer: Self-pay | Admitting: Internal Medicine

## 2016-01-26 ENCOUNTER — Other Ambulatory Visit: Payer: Self-pay | Admitting: *Deleted

## 2016-01-26 ENCOUNTER — Other Ambulatory Visit: Payer: Self-pay | Admitting: Internal Medicine

## 2016-01-26 MED ORDER — BUTALBITAL-APAP-CAFFEINE 50-325-40 MG PO TABS: ORAL_TABLET | ORAL | 3 refills | Status: DC

## 2016-01-26 NOTE — Telephone Encounter (Signed)
Patient requested to be sent to pharmacy.  

## 2016-01-28 ENCOUNTER — Other Ambulatory Visit: Payer: Self-pay

## 2016-01-28 ENCOUNTER — Other Ambulatory Visit: Payer: Self-pay | Admitting: *Deleted

## 2016-01-28 MED ORDER — HYDROCODONE-ACETAMINOPHEN 10-325 MG PO TABS
ORAL_TABLET | ORAL | 0 refills | Status: DC
Start: 2016-01-28 — End: 2016-01-29

## 2016-01-28 NOTE — Telephone Encounter (Signed)
Patient called Clinical Intake Assistant requesting a refill on Hydrocodone. Patient is a resident at [facility].  Patient informed that I will forward message to [facility] and Dr.Reed whom are at [facility] now to provide rx and document as historical in [facility].  Patient would like a call at [phone] when rx is signed and given to [facility] staff. If patient is unavailable please leave a message speaking slowly for patient is hearing impaired.

## 2016-01-28 NOTE — Telephone Encounter (Signed)
Patient requested and [facility] will pick up 

## 2016-01-29 MED ORDER — HYDROCODONE-ACETAMINOPHEN 10-325 MG PO TABS: ORAL_TABLET | ORAL | 0 refills | Status: DC

## 2016-01-29 NOTE — Telephone Encounter (Signed)
Left message on machine that rx is here at [facility] and ready for pick-up, also advised I could bring it to [facility] on next Tuesday if needed.

## 2016-02-02 ENCOUNTER — Telehealth: Payer: Self-pay | Admitting: *Deleted

## 2016-02-02 NOTE — Telephone Encounter (Signed)
Patient called and stated that she is moving 02/13/16 to [place] in [place] and she wants a Flu and Pneumonia Shot. Wants it brought to her. Sent to [place].

## 2016-02-03 NOTE — Telephone Encounter (Signed)
Left message on machine that we can not bring flu vaccines out of the office anymore, asked pt to return my call if any questions.

## 2016-02-17 DIAGNOSIS — E669 Obesity, unspecified: Secondary | ICD-10-CM | POA: Diagnosis not present

## 2016-02-17 DIAGNOSIS — G43909 Migraine, unspecified, not intractable, without status migrainosus: Secondary | ICD-10-CM | POA: Diagnosis not present

## 2016-02-17 DIAGNOSIS — Z0000 Encounter for general adult medical examination without abnormal findings: Secondary | ICD-10-CM | POA: Diagnosis not present

## 2016-02-17 DIAGNOSIS — E785 Hyperlipidemia, unspecified: Secondary | ICD-10-CM | POA: Diagnosis not present

## 2016-02-17 DIAGNOSIS — M545 Low back pain: Secondary | ICD-10-CM | POA: Diagnosis not present

## 2016-02-17 DIAGNOSIS — Z23 Encounter for immunization: Secondary | ICD-10-CM | POA: Diagnosis not present

## 2016-02-17 DIAGNOSIS — I10 Essential (primary) hypertension: Secondary | ICD-10-CM | POA: Diagnosis not present

## 2016-02-17 DIAGNOSIS — M170 Bilateral primary osteoarthritis of knee: Secondary | ICD-10-CM | POA: Diagnosis not present

## 2016-02-17 DIAGNOSIS — C50011 Malignant neoplasm of nipple and areola, right female breast: Secondary | ICD-10-CM | POA: Diagnosis not present

## 2016-02-17 DIAGNOSIS — G43719 Chronic migraine without aura, intractable, without status migrainosus: Secondary | ICD-10-CM | POA: Diagnosis not present

## 2016-02-24 ENCOUNTER — Other Ambulatory Visit: Payer: Self-pay | Admitting: Nurse Practitioner

## 2016-03-02 DIAGNOSIS — D229 Melanocytic nevi, unspecified: Secondary | ICD-10-CM | POA: Diagnosis not present

## 2016-03-02 DIAGNOSIS — E785 Hyperlipidemia, unspecified: Secondary | ICD-10-CM | POA: Diagnosis not present

## 2016-03-02 DIAGNOSIS — D485 Neoplasm of uncertain behavior of skin: Secondary | ICD-10-CM | POA: Diagnosis not present

## 2016-03-02 DIAGNOSIS — Z0000 Encounter for general adult medical examination without abnormal findings: Secondary | ICD-10-CM | POA: Diagnosis not present

## 2016-04-14 DIAGNOSIS — Z1231 Encounter for screening mammogram for malignant neoplasm of breast: Secondary | ICD-10-CM | POA: Diagnosis not present

## 2016-04-14 DIAGNOSIS — N6489 Other specified disorders of breast: Secondary | ICD-10-CM | POA: Diagnosis not present

## 2016-05-12 ENCOUNTER — Encounter: Payer: Medicare Other | Admitting: Internal Medicine

## 2016-05-12 ENCOUNTER — Encounter: Payer: Self-pay | Admitting: Internal Medicine

## 2016-08-09 DIAGNOSIS — Z9889 Other specified postprocedural states: Secondary | ICD-10-CM | POA: Diagnosis not present

## 2016-08-09 DIAGNOSIS — Z853 Personal history of malignant neoplasm of breast: Secondary | ICD-10-CM | POA: Diagnosis not present

## 2016-08-09 DIAGNOSIS — N6489 Other specified disorders of breast: Secondary | ICD-10-CM | POA: Diagnosis not present

## 2016-08-09 DIAGNOSIS — N6310 Unspecified lump in the right breast, unspecified quadrant: Secondary | ICD-10-CM | POA: Diagnosis not present

## 2016-10-22 DIAGNOSIS — J019 Acute sinusitis, unspecified: Secondary | ICD-10-CM | POA: Diagnosis not present

## 2016-10-22 DIAGNOSIS — E785 Hyperlipidemia, unspecified: Secondary | ICD-10-CM | POA: Diagnosis not present

## 2016-10-22 DIAGNOSIS — M545 Low back pain: Secondary | ICD-10-CM | POA: Diagnosis not present

## 2016-10-22 DIAGNOSIS — I10 Essential (primary) hypertension: Secondary | ICD-10-CM | POA: Diagnosis not present

## 2016-10-22 DIAGNOSIS — C50011 Malignant neoplasm of nipple and areola, right female breast: Secondary | ICD-10-CM | POA: Diagnosis not present

## 2017-01-31 DIAGNOSIS — L723 Sebaceous cyst: Secondary | ICD-10-CM | POA: Diagnosis not present

## 2017-01-31 DIAGNOSIS — L720 Epidermal cyst: Secondary | ICD-10-CM | POA: Diagnosis not present

## 2017-02-02 DIAGNOSIS — L723 Sebaceous cyst: Secondary | ICD-10-CM | POA: Diagnosis not present

## 2017-10-03 DIAGNOSIS — G8929 Other chronic pain: Secondary | ICD-10-CM | POA: Diagnosis not present

## 2017-10-03 DIAGNOSIS — M545 Low back pain: Secondary | ICD-10-CM | POA: Diagnosis not present

## 2017-10-03 DIAGNOSIS — R413 Other amnesia: Secondary | ICD-10-CM | POA: Diagnosis not present

## 2017-10-03 DIAGNOSIS — I10 Essential (primary) hypertension: Secondary | ICD-10-CM | POA: Diagnosis not present

## 2017-10-03 DIAGNOSIS — G4484 Primary exertional headache: Secondary | ICD-10-CM | POA: Diagnosis not present

## 2023-07-31 DEATH — deceased
